# Patient Record
Sex: Female | Born: 1940 | Race: White | Hispanic: No | Smoking: Former smoker
Health system: Southern US, Community
[De-identification: ages and names within clinical notes are randomized; demographics above are authoritative.]

## PROBLEM LIST (undated history)

## (undated) DIAGNOSIS — I10 Essential (primary) hypertension: Secondary | ICD-10-CM

## (undated) DIAGNOSIS — I219 Acute myocardial infarction, unspecified: Secondary | ICD-10-CM

## (undated) DIAGNOSIS — I2510 Atherosclerotic heart disease of native coronary artery without angina pectoris: Secondary | ICD-10-CM

## (undated) DIAGNOSIS — E785 Hyperlipidemia, unspecified: Secondary | ICD-10-CM

## (undated) HISTORY — DX: Atherosclerotic heart disease of native coronary artery without angina pectoris: I25.10

## (undated) HISTORY — PX: ABDOMINAL HYSTERECTOMY: SHX81

## (undated) HISTORY — DX: Hyperlipidemia, unspecified: E78.5

## (undated) HISTORY — DX: Essential (primary) hypertension: I10

## (undated) HISTORY — PX: TONSILLECTOMY: SUR1361

## (undated) HISTORY — PX: APPENDECTOMY: SHX54

## (undated) HISTORY — PX: OTHER SURGICAL HISTORY: SHX169

---

## 1969-03-11 HISTORY — PX: TUBAL LIGATION: SHX77

## 2000-03-11 DIAGNOSIS — I219 Acute myocardial infarction, unspecified: Secondary | ICD-10-CM

## 2000-03-11 HISTORY — DX: Acute myocardial infarction, unspecified: I21.9

## 2005-03-11 HISTORY — PX: OTHER SURGICAL HISTORY: SHX169

## 2011-06-10 HISTORY — PX: CHOLECYSTECTOMY: SHX55

## 2013-05-25 HISTORY — PX: EYE SURGERY: SHX253

## 2014-12-09 HISTORY — PX: OTHER SURGICAL HISTORY: SHX169

## 2015-03-30 ENCOUNTER — Other Ambulatory Visit: Payer: Self-pay | Admitting: Surgical

## 2015-04-04 NOTE — Patient Instructions (Signed)
Rebekah Skinner  04/04/2015   Your procedure is scheduled on: 04-11-15  Report to [facility] Main  Entrance take [facility]  elevators to 3rd floor to  Short Stay Center at 1030  AM.  Call this number if you have problems the morning of surgery [phone]   Remember: ONLY 1 PERSON MAY GO WITH YOU TO SHORT STAY TO GET  READY MORNING OF YOUR SURGERY.  Do not eat food or drink liquids :After Midnight.     Take these medicines the morning of surgery with A SIP OF WATER: NONE              You may not have any metal on your body including hair pins and              piercings  Do not wear jewelry, make-up, lotions, powders or perfumes, deodorant             Do not wear nail polish.  Do not shave  48 hours prior to surgery.              Men may shave face and neck.   Do not bring valuables to the hospital.  IS NOT             RESPONSIBLE   FOR VALUABLES.  Contacts, dentures or bridgework may not be worn into surgery.  Leave suitcase in the car. After surgery it may be brought to your room.                  Please read over the following fact sheets you were given: _____________________________________________________________________             [facility] - Preparing for Surgery Before surgery, you can play an important role.  Because skin is not sterile, your skin needs to be as free of germs as possible.  You can reduce the number of germs on your skin by washing with CHG (chlorahexidine gluconate) soap before surgery.  CHG is an antiseptic cleaner which kills germs and bonds with the skin to continue killing germs even after washing. Please DO NOT use if you have an allergy to CHG or antibacterial soaps.  If your skin becomes reddened/irritated stop using the CHG and inform your nurse when you arrive at Short Stay. Do not shave (including legs and underarms) for at least 48 hours prior to the first CHG shower.  You may shave your face/neck. Please follow  these instructions carefully:  1.  Shower with CHG Soap the night before surgery and the  morning of Surgery.  2.  If you choose to wash your hair, wash your hair first as usual with your  normal  shampoo.  3.  After you shampoo, rinse your hair and body thoroughly to remove the  shampoo.                           4.  Use CHG as you would any other liquid soap.  You can apply chg directly  to the skin and wash                       Gently with a scrungie or clean washcloth.  5.  Apply the CHG Soap to your body ONLY FROM THE NECK DOWN.   Do not use on face/ open  Wound or open sores. Avoid contact with eyes, ears mouth and genitals (private parts).                       Wash face,  Genitals (private parts) with your normal soap.             6.  Wash thoroughly, paying special attention to the area where your surgery  will be performed.  7.  Thoroughly rinse your body with warm water from the neck down.  8.  DO NOT shower/wash with your normal soap after using and rinsing off  the CHG Soap.                9.  Pat yourself dry with a clean towel.            10.  Wear clean pajamas.            11.  Place clean sheets on your bed the night of your first shower and do not  sleep with pets. Day of Surgery : Do not apply any lotions/deodorants the morning of surgery.  Please wear clean clothes to the hospital/surgery center.  FAILURE TO FOLLOW THESE INSTRUCTIONS MAY RESULT IN THE CANCELLATION OF YOUR SURGERY PATIENT SIGNATURE_________________________________  NURSE SIGNATURE__________________________________  ________________________________________________________________________   Adam Phenix  An incentive spirometer is a tool that can help keep your lungs clear and active. This tool measures how well you are filling your lungs with each breath. Taking long deep breaths may help reverse or decrease the chance of developing breathing (pulmonary) problems  (especially infection) following:  A long period of time when you are unable to move or be active. BEFORE THE PROCEDURE   If the spirometer includes an indicator to show your best effort, your nurse or respiratory therapist will set it to a desired goal.  If possible, sit up straight or lean slightly forward. Try not to slouch.  Hold the incentive spirometer in an upright position. INSTRUCTIONS FOR USE   Sit on the edge of your bed if possible, or sit up as far as you can in bed or on a chair.  Hold the incentive spirometer in an upright position.  Breathe out normally.  Place the mouthpiece in your mouth and seal your lips tightly around it.  Breathe in slowly and as deeply as possible, raising the piston or the ball toward the top of the column.  Hold your breath for 3-5 seconds or for as long as possible. Allow the piston or ball to fall to the bottom of the column.  Remove the mouthpiece from your mouth and breathe out normally.  Rest for a few seconds and repeat Steps 1 through 7 at least 10 times every 1-2 hours when you are awake. Take your time and take a few normal breaths between deep breaths.  The spirometer may include an indicator to show your best effort. Use the indicator as a goal to work toward during each repetition.  After each set of 10 deep breaths, practice coughing to be sure your lungs are clear. If you have an incision (the cut made at the time of surgery), support your incision when coughing by placing a pillow or rolled up towels firmly against it. Once you are able to get out of bed, walk around indoors and cough well. You may stop using the incentive spirometer when instructed by your caregiver.  RISKS AND COMPLICATIONS  Take your time so you do not get  dizzy or light-headed.  If you are in pain, you may need to take or ask for pain medication before doing incentive spirometry. It is harder to take a deep breath if you are having pain. AFTER  USE  Rest and breathe slowly and easily.  It can be helpful to keep track of a log of your progress. Your caregiver can provide you with a simple table to help with this. If you are using the spirometer at home, follow these instructions: [place] IF:   You are having difficultly using the spirometer.  You have trouble using the spirometer as often as instructed.  Your pain medication is not giving enough relief while using the spirometer.  You develop fever of 100.5 F (38.1 C) or higher. SEEK IMMEDIATE MEDICAL CARE IF:   You cough up bloody sputum that had not been present before.  You develop fever of 102 F (38.9 C) or greater.  You develop worsening pain at or near the incision site. MAKE SURE YOU:   Understand these instructions.  Will watch your condition.  Will get help right away if you are not doing well or get worse. Document Released: 07/08/2006 Document Revised: 05/20/2011 Document Reviewed: 09/08/2006 Exit[employer] Patient Information 2014 Exit[employer], [place].   ________________________________________________________________________  WHAT IS A BLOOD TRANSFUSION? Blood Transfusion Information  A transfusion is the replacement of blood or some of its parts. Blood is made up of multiple cells which provide different functions.  Red blood cells carry oxygen and are used for blood loss replacement.  White blood cells fight against infection.  Platelets control bleeding.  Plasma helps clot blood.  Other blood products are available for specialized needs, such as hemophilia or other clotting disorders. BEFORE THE TRANSFUSION  Who gives blood for transfusions?   Healthy volunteers who are fully evaluated to make sure their blood is safe. This is blood bank blood. Transfusion therapy is the safest it has ever been in the practice of medicine. Before blood is taken from a donor, a complete history is taken to make sure that person has no history of diseases  nor engages in risky social behavior (examples are intravenous drug use or sexual activity with multiple partners). The donor's travel history is screened to minimize risk of transmitting infections, such as malaria. The donated blood is tested for signs of infectious diseases, such as HIV and hepatitis. The blood is then tested to be sure it is compatible with you in order to minimize the chance of a transfusion reaction. If you or a relative donates blood, this is often done in anticipation of surgery and is not appropriate for emergency situations. It takes many days to process the donated blood. RISKS AND COMPLICATIONS Although transfusion therapy is very safe and saves many lives, the main dangers of transfusion include:   Getting an infectious disease.  Developing a transfusion reaction. This is an allergic reaction to something in the blood you were given. Every precaution is taken to prevent this. The decision to have a blood transfusion has been considered carefully by your caregiver before blood is given. Blood is not given unless the benefits outweigh the risks. AFTER THE TRANSFUSION  Right after receiving a blood transfusion, you will usually feel much better and more energetic. This is especially true if your red blood cells have gotten low (anemic). The transfusion raises the level of the red blood cells which carry oxygen, and this usually causes an energy increase.  The nurse administering the transfusion will  monitor you carefully for complications. HOME CARE INSTRUCTIONS  No special instructions are needed after a transfusion. You may find your energy is better. Speak with your caregiver about any limitations on activity for underlying diseases you may have. SEEK MEDICAL CARE IF:   Your condition is not improving after your transfusion.  You develop redness or irritation at the intravenous (IV) site. SEEK IMMEDIATE MEDICAL CARE IF:  Any of the following symptoms occur over the  next 12 hours:  Shaking chills.  You have a temperature by mouth above 102 F (38.9 C), not controlled by medicine.  Chest, back, or muscle pain.  People around you feel you are not acting correctly or are confused.  Shortness of breath or difficulty breathing.  Dizziness and fainting.  You get a rash or develop hives.  You have a decrease in urine output.  Your urine turns a dark color or changes to pink, red, or brown. Any of the following symptoms occur over the next 10 days:  You have a temperature by mouth above 102 F (38.9 C), not controlled by medicine.  Shortness of breath.  Weakness after normal activity.  The white part of the eye turns yellow (jaundice).  You have a decrease in the amount of urine or are urinating less often.  Your urine turns a dark color or changes to pink, red, or brown. Document Released: 02/23/2000 Document Revised: 05/20/2011 Document Reviewed: 10/12/2007 [facility] Patient Information 2014 [place], [place].  _______________________________________________________________________

## 2015-04-04 NOTE — Progress Notes (Signed)
EKG 11-21-14, ECHO 11-21-14 LEXISCAN 11-28-14 AND LOV DR Jillyn Hidden [facility] ON CHART. REQUESTED ANY CARDIOLOGY CLEARANCE NOTE DR VELVET AT DR GIOFFRE;S OFFICE TO BE FAXED.

## 2015-04-05 NOTE — Progress Notes (Signed)
Cardiac clearance note dr Hyacinth Meeker on chart for 04-11-15 surgery

## 2015-04-07 ENCOUNTER — Encounter (HOSPITAL_COMMUNITY): Payer: Self-pay

## 2015-04-07 ENCOUNTER — Encounter (HOSPITAL_COMMUNITY)
Admission: RE | Admit: 2015-04-07 | Discharge: 2015-04-07 | Disposition: A | Discharge status: Home / Self Care | Payer: Medicare Other | Source: Ambulatory Visit | Attending: Orthopedic Surgery | Admitting: Orthopedic Surgery

## 2015-04-07 ENCOUNTER — Ambulatory Visit (HOSPITAL_COMMUNITY)
Admission: RE | Admit: 2015-04-07 | Discharge: 2015-04-07 | Disposition: A | Discharge status: Home / Self Care | Payer: Medicare Other | Source: Ambulatory Visit | Attending: Surgical | Admitting: Surgical

## 2015-04-07 DIAGNOSIS — Z01818 Encounter for other preprocedural examination: Secondary | ICD-10-CM | POA: Insufficient documentation

## 2015-04-07 HISTORY — DX: Acute myocardial infarction, unspecified: I21.9

## 2015-04-07 LAB — URINALYSIS, ROUTINE W REFLEX MICROSCOPIC
Bilirubin Urine: NEGATIVE
Glucose, UA: NEGATIVE mg/dL
Hgb urine dipstick: NEGATIVE
Ketones, ur: NEGATIVE mg/dL
Nitrite: NEGATIVE
Protein, ur: NEGATIVE mg/dL
Specific Gravity, Urine: 1.027 (ref 1.005–1.030)
pH: 5.5 (ref 5.0–8.0)

## 2015-04-07 LAB — CBC WITH DIFFERENTIAL/PLATELET
Basophils Absolute: 0 10*3/uL (ref 0.0–0.1)
Basophils Relative: 0 %
Eosinophils Absolute: 0.1 10*3/uL (ref 0.0–0.7)
Eosinophils Relative: 1 %
HCT: 37.1 % (ref 36.0–46.0)
Hemoglobin: 11.9 g/dL — ABNORMAL LOW (ref 12.0–15.0)
Lymphocytes Relative: 26 %
Lymphs Abs: 3 10*3/uL (ref 0.7–4.0)
MCH: 31.1 pg (ref 26.0–34.0)
MCHC: 32.1 g/dL (ref 30.0–36.0)
MCV: 96.9 fL (ref 78.0–100.0)
Monocytes Absolute: 0.8 10*3/uL (ref 0.1–1.0)
Monocytes Relative: 7 %
Neutro Abs: 7.7 10*3/uL (ref 1.7–7.7)
Neutrophils Relative %: 66 %
Platelets: 244 10*3/uL (ref 150–400)
RBC: 3.83 MIL/uL — ABNORMAL LOW (ref 3.87–5.11)
RDW: 14.1 % (ref 11.5–15.5)
WBC: 11.6 10*3/uL — ABNORMAL HIGH (ref 4.0–10.5)

## 2015-04-07 LAB — COMPREHENSIVE METABOLIC PANEL
ALT: 26 U/L (ref 14–54)
AST: 30 U/L (ref 15–41)
Albumin: 3.8 g/dL (ref 3.5–5.0)
Alkaline Phosphatase: 77 U/L (ref 38–126)
Anion gap: 11 (ref 5–15)
BUN: 29 mg/dL — ABNORMAL HIGH (ref 6–20)
CO2: 22 mmol/L (ref 22–32)
Calcium: 9 mg/dL (ref 8.9–10.3)
Chloride: 104 mmol/L (ref 101–111)
Creatinine, Ser: 1.25 mg/dL — ABNORMAL HIGH (ref 0.44–1.00)
GFR calc Af Amer: 48 mL/min — ABNORMAL LOW (ref >60)
GFR calc non Af Amer: 42 mL/min — ABNORMAL LOW (ref >60)
Glucose, Bld: 88 mg/dL (ref 65–99)
Potassium: 5.1 mmol/L (ref 3.5–5.1)
Sodium: 137 mmol/L (ref 135–145)
Total Bilirubin: 0.4 mg/dL (ref 0.3–1.2)
Total Protein: 7.3 g/dL (ref 6.5–8.1)

## 2015-04-07 LAB — URINE MICROSCOPIC-ADD ON

## 2015-04-07 LAB — PROTIME-INR
INR: 1.11 (ref 0.00–1.49)
Prothrombin Time: 14 seconds (ref 11.6–15.2)

## 2015-04-07 LAB — SURGICAL PCR SCREEN
MRSA, PCR: NEGATIVE
Staphylococcus aureus: NEGATIVE

## 2015-04-07 LAB — ABO/RH: ABO/RH(D): O POS

## 2015-04-07 LAB — APTT: aPTT: 22 seconds — ABNORMAL LOW (ref 24–37)

## 2015-04-07 NOTE — Progress Notes (Signed)
Micro, ua results faxed by epic to dr Darrelyn Hillock

## 2015-04-10 NOTE — H&P (Signed)
TOTAL KNEE ADMISSION H&P  Patient is being admitted for right total knee arthroplasty.  Subjective:  Chief Complaint:right knee pain.  HPI: Rebekah Skinner, 75 y.o. female, has a history of pain and functional disability in the right knee due to arthritis and has failed non-surgical conservative treatments for greater than 12 weeks to includeNSAID's and/or analgesics, corticosteriod injections, flexibility and strengthening excercises and activity modification.  Onset of symptoms was gradual, starting 5 years ago with gradually worsening course since that time. The patient noted prior procedures on the knee to include  arthroscopy and menisectomy on the right knee(s).  Patient currently rates pain in the right knee(s) at 7 out of 10 with activity. Patient has night pain, worsening of pain with activity and weight bearing, pain that interferes with activities of daily living, pain with passive range of motion, crepitus and joint swelling.  Patient has evidence of periarticular osteophytes and joint space narrowing by imaging studies. There is no active infection.   Past Medical History  Diagnosis Date  . Myocardial infarction [facility]) 2002    Past Surgical History  Procedure Laterality Date  . Tonsillectomy  7th grade  . Appendectomy  8th grade  . Tubal ligation  1971  . Abdominal hysterectomy  1 ovary removed  . Surgery for ruptured abdominal aortic aneurysm  2007  . Cholecystectomy  april 2013  . Other surgery  2007    12 other surgeries due to ruptued aneurysm  . Right knee meniscus repair  sept 30, 2016  . Eye surgery Left 05-25-2013    cataract lens replacement  . 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  aspirin EC 81 MG tablet, Take 81 mg by mouth at bedtime., Disp: , Rfl:  .  cholecalciferol (VITAMIN D) 1000 units tablet, Take 1,000 Units by mouth daily., Disp: , Rfl:  .  losartan (COZAAR) 100 MG tablet, Take 100 mg by mouth at bedtime.,  Disp: , Rfl:  .  meloxicam (MOBIC) 15 MG tablet, Take 15 mg by mouth daily., Disp: , Rfl:  .  vitamin B-12 (CYANOCOBALAMIN) 1000 MCG tablet, Take 1,000 mcg by mouth daily., Disp: , Rfl:   Allergies  Allergen Reactions  . Bactrim Ds [Sulfamethoxazole-Trimethoprim] Nausea And Vomiting  . Darvon [Propoxyphene] Nausea And Vomiting  . Docusate Nausea And Vomiting  . Other     Darvocet=nausea vomiting  . Zyvox [Linezolid] Nausea And Vomiting    Social History  Substance Use Topics  . Smoking status: Former Smoker    Types: Cigarettes  . Smokeless tobacco: Never Used     Comment: social smoker quit years ago  . Alcohol Use: No    Review of Systems  Constitutional: Negative.   HENT: Negative.   Eyes: Negative.   Respiratory: Positive for shortness of breath. Negative for cough, hemoptysis, sputum production and wheezing.        SOB with exertion  Cardiovascular: Negative.   Gastrointestinal: Positive for diarrhea. Negative for heartburn, nausea, vomiting, abdominal pain, constipation, blood in stool and melena.  Genitourinary: Positive for frequency. Negative for dysuria, urgency, hematuria and flank pain.  Musculoskeletal: Positive for myalgias and joint pain. Negative for back pain, falls and neck pain.       Right knee pain  Skin: Negative.   Neurological: Negative.   Endo/Heme/Allergies: Negative.   Psychiatric/Behavioral: Negative.     Objective:  Physical Exam  Constitutional: She is oriented to person,  place, and time. She appears well-developed. No distress.  Overweight  HENT:  Head: Normocephalic and atraumatic.  Right Ear: External ear normal.  Left Ear: External ear normal.  Nose: Nose normal.  Mouth/Throat: Oropharynx is clear and moist.  Eyes: Conjunctivae and EOM are normal.  Neck: Normal range of motion. Neck supple.  Cardiovascular: Normal rate, regular rhythm, normal heart sounds and intact distal pulses.   No murmur heard. Respiratory: Effort normal and  breath sounds normal. No respiratory distress. She has no wheezes.  GI: Soft. She exhibits no distension. There is no tenderness.  Musculoskeletal:       Right hip: Normal.       Left hip: Normal.       Right knee: She exhibits decreased range of motion and swelling. She exhibits no effusion and no erythema. Tenderness found. Medial joint line and lateral joint line tenderness noted.       Left knee: Normal.  Neurological: She is alert and oriented to person, place, and time. She has normal strength and normal reflexes. No sensory deficit.  Skin: No rash noted. She is not diaphoretic. No erythema.  Psychiatric: She has a normal mood and affect. Her behavior is normal.   Vitals  Weight: 216 lb Height: 65in Body Surface Area: 2.04 m Body Mass Index: 35.94 kg/m  Pulse: 80 (Regular)  BP: 135/76 (Sitting, Left Arm, Standard)   Imaging Review Plain radiographs demonstrate severe degenerative joint disease of the right knee(s). The overall alignment ismild valgus. The bone quality appears to be good for age and reported activity level.  Assessment/Plan:  End stage primary osteoarthritis, right knee   The patient history, physical examination, clinical judgment of the provider and imaging studies are consistent with end stage degenerative joint disease of the right knee(s) and total knee arthroplasty is deemed medically necessary. The treatment options including medical management, injection therapy arthroscopy and arthroplasty were discussed at length. The risks and benefits of total knee arthroplasty were presented and reviewed. The risks due to aseptic loosening, infection, stiffness, patella tracking problems, thromboembolic complications and other imponderables were discussed. The patient acknowledged the explanation, agreed to proceed with the plan and consent was signed. Patient is being admitted for inpatient treatment for surgery, pain control, PT, OT, prophylactic antibiotics,  VTE prophylaxis, progressive ambulation and ADL's and discharge planning. The patient is planning to be discharged home with home health services     PCP: Dr. Lynnell Chad, PA-C

## 2015-04-11 ENCOUNTER — Encounter (HOSPITAL_COMMUNITY): Payer: Self-pay | Admitting: *Deleted

## 2015-04-11 ENCOUNTER — Inpatient Hospital Stay (HOSPITAL_COMMUNITY): Payer: Medicare Other | Admitting: Registered Nurse

## 2015-04-11 ENCOUNTER — Inpatient Hospital Stay (HOSPITAL_COMMUNITY)
Admission: RE | Admit: 2015-04-11 | Discharge: 2015-04-13 | DRG: 470 | Disposition: A | Discharge status: Home Health | Payer: Medicare Other | Source: Ambulatory Visit | Attending: Orthopedic Surgery | Admitting: Orthopedic Surgery

## 2015-04-11 ENCOUNTER — Encounter (HOSPITAL_COMMUNITY)
Admission: RE | Disposition: A | Discharge status: Home Health | Payer: Self-pay | Source: Ambulatory Visit | Attending: Orthopedic Surgery

## 2015-04-11 DIAGNOSIS — Z7982 Long term (current) use of aspirin: Secondary | ICD-10-CM

## 2015-04-11 DIAGNOSIS — M1711 Unilateral primary osteoarthritis, right knee: Secondary | ICD-10-CM | POA: Diagnosis present

## 2015-04-11 DIAGNOSIS — M24561 Contracture, right knee: Secondary | ICD-10-CM | POA: Diagnosis present

## 2015-04-11 DIAGNOSIS — Z79899 Other long term (current) drug therapy: Secondary | ICD-10-CM | POA: Diagnosis not present

## 2015-04-11 DIAGNOSIS — Z96659 Presence of unspecified artificial knee joint: Secondary | ICD-10-CM

## 2015-04-11 DIAGNOSIS — Z6835 Body mass index (BMI) 35.0-35.9, adult: Secondary | ICD-10-CM

## 2015-04-11 DIAGNOSIS — Z87891 Personal history of nicotine dependence: Secondary | ICD-10-CM

## 2015-04-11 DIAGNOSIS — M25561 Pain in right knee: Secondary | ICD-10-CM | POA: Diagnosis present

## 2015-04-11 DIAGNOSIS — I252 Old myocardial infarction: Secondary | ICD-10-CM | POA: Diagnosis not present

## 2015-04-11 HISTORY — PX: TOTAL KNEE ARTHROPLASTY: SHX125

## 2015-04-11 LAB — TYPE AND SCREEN
ABO/RH(D): O POS
Antibody Screen: NEGATIVE

## 2015-04-11 SURGERY — ARTHROPLASTY, KNEE, TOTAL
Anesthesia: General | Site: Knee | Laterality: Right

## 2015-04-11 MED ORDER — FLEET ENEMA 7-19 GM/118ML RE ENEM: 1.0000 | ENEMA | Freq: Once | RECTAL | Status: DC | PRN

## 2015-04-11 MED ORDER — CEFAZOLIN SODIUM 1-5 GM-% IV SOLN
1.0000 g | Freq: Four times a day (QID) | INTRAVENOUS | Status: AC
  Administered 2015-04-11 (×2): 1 g via INTRAVENOUS
  Filled 2015-04-11 (×2): qty 50

## 2015-04-11 MED ORDER — LACTATED RINGERS IV SOLN
INTRAVENOUS | Status: DC
  Administered 2015-04-11 – 2015-04-12 (×3): via INTRAVENOUS

## 2015-04-11 MED ORDER — BUPIVACAINE-EPINEPHRINE (PF) 0.5% -1:200000 IJ SOLN
INTRAMUSCULAR | Status: AC
  Filled 2015-04-11: qty 30

## 2015-04-11 MED ORDER — POLYETHYLENE GLYCOL 3350 17 G PO PACK: 17.0000 g | PACK | Freq: Every day | ORAL | Status: DC | PRN

## 2015-04-11 MED ORDER — LIDOCAINE HCL (CARDIAC) 20 MG/ML IV SOLN
INTRAVENOUS | Status: DC | PRN
  Administered 2015-04-11: 80 mg via INTRAVENOUS

## 2015-04-11 MED ORDER — FERROUS SULFATE 325 (65 FE) MG PO TABS
325.0000 mg | ORAL_TABLET | Freq: Three times a day (TID) | ORAL | Status: DC
  Administered 2015-04-11 – 2015-04-13 (×5): 325 mg via ORAL
  Filled 2015-04-11 (×8): qty 1

## 2015-04-11 MED ORDER — SUGAMMADEX SODIUM 200 MG/2ML IV SOLN
INTRAVENOUS | Status: DC | PRN
  Administered 2015-04-11: 200 mg via INTRAVENOUS

## 2015-04-11 MED ORDER — SUGAMMADEX SODIUM 200 MG/2ML IV SOLN
INTRAVENOUS | Status: AC
  Filled 2015-04-11: qty 2

## 2015-04-11 MED ORDER — SODIUM CHLORIDE 0.9 % IJ SOLN
INTRAMUSCULAR | Status: DC | PRN
  Administered 2015-04-11: 20 mL

## 2015-04-11 MED ORDER — ONDANSETRON HCL 4 MG/2ML IJ SOLN
INTRAMUSCULAR | Status: DC | PRN
  Administered 2015-04-11: 4 mg via INTRAVENOUS

## 2015-04-11 MED ORDER — FENTANYL CITRATE (PF) 100 MCG/2ML IJ SOLN
INTRAMUSCULAR | Status: AC
  Filled 2015-04-11: qty 2

## 2015-04-11 MED ORDER — DEXAMETHASONE SODIUM PHOSPHATE 10 MG/ML IJ SOLN
INTRAMUSCULAR | Status: AC
  Filled 2015-04-11: qty 1

## 2015-04-11 MED ORDER — LOSARTAN POTASSIUM 50 MG PO TABS
100.0000 mg | ORAL_TABLET | Freq: Every day | ORAL | Status: DC
  Administered 2015-04-11 – 2015-04-12 (×2): 100 mg via ORAL
  Filled 2015-04-11 (×3): qty 2

## 2015-04-11 MED ORDER — BUPIVACAINE-EPINEPHRINE (PF) 0.5% -1:200000 IJ SOLN
INTRAMUSCULAR | Status: DC | PRN
  Administered 2015-04-11: 25 mL

## 2015-04-11 MED ORDER — HYDROCODONE-ACETAMINOPHEN 5-325 MG PO TABS
1.0000 | ORAL_TABLET | ORAL | Status: DC | PRN
  Administered 2015-04-12 (×4): 2 via ORAL
  Filled 2015-04-11 (×5): qty 2

## 2015-04-11 MED ORDER — BUPIVACAINE HCL (PF) 0.25 % IJ SOLN
INTRAMUSCULAR | Status: DC | PRN
  Administered 2015-04-11: 20 mL

## 2015-04-11 MED ORDER — LIDOCAINE HCL (CARDIAC) 20 MG/ML IV SOLN
INTRAVENOUS | Status: AC
  Filled 2015-04-11: qty 5

## 2015-04-11 MED ORDER — HYDROMORPHONE HCL 1 MG/ML IJ SOLN
INTRAMUSCULAR | Status: DC | PRN
  Administered 2015-04-11 (×2): 0.5 mg via INTRAVENOUS

## 2015-04-11 MED ORDER — SODIUM CHLORIDE 0.9 % IJ SOLN
INTRAMUSCULAR | Status: AC
  Filled 2015-04-11: qty 20

## 2015-04-11 MED ORDER — ONDANSETRON HCL 4 MG/2ML IJ SOLN
4.0000 mg | Freq: Four times a day (QID) | INTRAMUSCULAR | Status: DC | PRN
  Administered 2015-04-11: 4 mg via INTRAVENOUS
  Filled 2015-04-11: qty 2

## 2015-04-11 MED ORDER — THROMBIN 5000 UNITS EX SOLR
OROMUCOSAL | Status: DC | PRN
  Administered 2015-04-11: 14:00:00 via TOPICAL

## 2015-04-11 MED ORDER — FENTANYL CITRATE (PF) 100 MCG/2ML IJ SOLN
INTRAMUSCULAR | Status: DC | PRN
  Administered 2015-04-11 (×5): 50 ug via INTRAVENOUS

## 2015-04-11 MED ORDER — BUPIVACAINE LIPOSOME 1.3 % IJ SUSP
20.0000 mL | Freq: Once | INTRAMUSCULAR | Status: AC
  Administered 2015-04-11: 20 mL
  Filled 2015-04-11: qty 20

## 2015-04-11 MED ORDER — ONDANSETRON HCL 4 MG PO TABS: 4.0000 mg | ORAL_TABLET | Freq: Four times a day (QID) | ORAL | Status: DC | PRN

## 2015-04-11 MED ORDER — RIVAROXABAN 10 MG PO TABS
10.0000 mg | ORAL_TABLET | Freq: Every day | ORAL | Status: DC
  Administered 2015-04-12 – 2015-04-13 (×2): 10 mg via ORAL
  Filled 2015-04-11 (×3): qty 1

## 2015-04-11 MED ORDER — SUCCINYLCHOLINE CHLORIDE 20 MG/ML IJ SOLN
INTRAMUSCULAR | Status: DC | PRN
  Administered 2015-04-11: 100 mg via INTRAVENOUS

## 2015-04-11 MED ORDER — BISACODYL 5 MG PO TBEC: 5.0000 mg | DELAYED_RELEASE_TABLET | Freq: Every day | ORAL | Status: DC | PRN

## 2015-04-11 MED ORDER — MIDAZOLAM HCL 5 MG/5ML IJ SOLN
INTRAMUSCULAR | Status: DC | PRN
  Administered 2015-04-11 (×2): 1 mg via INTRAVENOUS

## 2015-04-11 MED ORDER — PHENOL 1.4 % MT LIQD
1.0000 | OROMUCOSAL | Status: DC | PRN
  Filled 2015-04-11: qty 177

## 2015-04-11 MED ORDER — PROPOFOL 10 MG/ML IV BOLUS
INTRAVENOUS | Status: AC
  Filled 2015-04-11: qty 20

## 2015-04-11 MED ORDER — PROPOFOL 10 MG/ML IV BOLUS
INTRAVENOUS | Status: DC | PRN
  Administered 2015-04-11: 160 mg via INTRAVENOUS

## 2015-04-11 MED ORDER — METHOCARBAMOL 500 MG PO TABS
500.0000 mg | ORAL_TABLET | Freq: Four times a day (QID) | ORAL | Status: DC | PRN
  Administered 2015-04-12: 500 mg via ORAL
  Filled 2015-04-11: qty 1

## 2015-04-11 MED ORDER — FENTANYL CITRATE (PF) 100 MCG/2ML IJ SOLN
25.0000 ug | INTRAMUSCULAR | Status: DC | PRN
  Administered 2015-04-11 (×3): 50 ug via INTRAVENOUS

## 2015-04-11 MED ORDER — VITAMIN B-12 1000 MCG PO TABS
1000.0000 ug | ORAL_TABLET | Freq: Every day | ORAL | Status: DC
  Administered 2015-04-11 – 2015-04-13 (×3): 1000 ug via ORAL
  Filled 2015-04-11 (×3): qty 1

## 2015-04-11 MED ORDER — FENTANYL CITRATE (PF) 250 MCG/5ML IJ SOLN
INTRAMUSCULAR | Status: AC
  Filled 2015-04-11: qty 5

## 2015-04-11 MED ORDER — SODIUM CHLORIDE 0.9 % IR SOLN
Status: DC | PRN
  Administered 2015-04-11: 500 mL

## 2015-04-11 MED ORDER — ACETAMINOPHEN 650 MG RE SUPP: 650.0000 mg | Freq: Four times a day (QID) | RECTAL | Status: DC | PRN

## 2015-04-11 MED ORDER — THROMBIN 5000 UNITS EX SOLR
CUTANEOUS | Status: AC
  Filled 2015-04-11: qty 5000

## 2015-04-11 MED ORDER — LACTATED RINGERS IV SOLN
INTRAVENOUS | Status: DC
  Administered 2015-04-11: 12:00:00 via INTRAVENOUS

## 2015-04-11 MED ORDER — MENTHOL 3 MG MT LOZG
1.0000 | LOZENGE | OROMUCOSAL | Status: DC | PRN
  Filled 2015-04-11: qty 9

## 2015-04-11 MED ORDER — ACETAMINOPHEN 325 MG PO TABS: 650.0000 mg | ORAL_TABLET | Freq: Four times a day (QID) | ORAL | Status: DC | PRN

## 2015-04-11 MED ORDER — OXYCODONE-ACETAMINOPHEN 5-325 MG PO TABS
2.0000 | ORAL_TABLET | ORAL | Status: DC | PRN
  Administered 2015-04-11 – 2015-04-13 (×4): 2 via ORAL
  Filled 2015-04-11 (×4): qty 2

## 2015-04-11 MED ORDER — BUPIVACAINE HCL (PF) 0.25 % IJ SOLN
INTRAMUSCULAR | Status: AC
  Filled 2015-04-11: qty 30

## 2015-04-11 MED ORDER — HYDROMORPHONE HCL 2 MG/ML IJ SOLN
INTRAMUSCULAR | Status: AC
  Filled 2015-04-11: qty 1

## 2015-04-11 MED ORDER — HYDROMORPHONE HCL 1 MG/ML IJ SOLN: 1.0000 mg | INTRAMUSCULAR | Status: DC | PRN

## 2015-04-11 MED ORDER — TRANEXAMIC ACID 1000 MG/10ML IV SOLN
2000.0000 mg | Freq: Once | INTRAVENOUS | Status: DC
  Filled 2015-04-11: qty 20

## 2015-04-11 MED ORDER — TRANEXAMIC ACID 1000 MG/10ML IV SOLN
2000.0000 mg | INTRAVENOUS | Status: DC | PRN
  Administered 2015-04-11: 2000 mg via TOPICAL

## 2015-04-11 MED ORDER — CELECOXIB 200 MG PO CAPS
200.0000 mg | ORAL_CAPSULE | Freq: Two times a day (BID) | ORAL | Status: DC
  Administered 2015-04-11 – 2015-04-13 (×4): 200 mg via ORAL
  Filled 2015-04-11 (×5): qty 1

## 2015-04-11 MED ORDER — METHOCARBAMOL 1000 MG/10ML IJ SOLN
500.0000 mg | Freq: Four times a day (QID) | INTRAVENOUS | Status: DC | PRN
  Administered 2015-04-11: 500 mg via INTRAVENOUS
  Filled 2015-04-11 (×2): qty 5

## 2015-04-11 MED ORDER — ALUM & MAG HYDROXIDE-SIMETH 200-200-20 MG/5ML PO SUSP: 30.0000 mL | ORAL | Status: DC | PRN

## 2015-04-11 MED ORDER — SODIUM CHLORIDE 0.9 % IR SOLN
Status: AC
  Filled 2015-04-11: qty 1

## 2015-04-11 MED ORDER — CEFAZOLIN SODIUM-DEXTROSE 2-3 GM-% IV SOLR
2.0000 g | INTRAVENOUS | Status: AC
  Administered 2015-04-11: 2 g via INTRAVENOUS

## 2015-04-11 MED ORDER — ROCURONIUM BROMIDE 100 MG/10ML IV SOLN
INTRAVENOUS | Status: DC | PRN
  Administered 2015-04-11: 30 mg via INTRAVENOUS

## 2015-04-11 MED ORDER — CEFAZOLIN SODIUM-DEXTROSE 2-3 GM-% IV SOLR
INTRAVENOUS | Status: AC
  Filled 2015-04-11: qty 50

## 2015-04-11 MED ORDER — DEXAMETHASONE SODIUM PHOSPHATE 10 MG/ML IJ SOLN
INTRAMUSCULAR | Status: DC | PRN
  Administered 2015-04-11: 10 mg via INTRAVENOUS

## 2015-04-11 MED ORDER — MIDAZOLAM HCL 2 MG/2ML IJ SOLN
INTRAMUSCULAR | Status: AC
  Filled 2015-04-11: qty 2

## 2015-04-11 MED ORDER — ONDANSETRON HCL 4 MG/2ML IJ SOLN: 4.0000 mg | Freq: Once | INTRAMUSCULAR | Status: DC | PRN

## 2015-04-11 SURGICAL SUPPLY — 63 items
BAG DECANTER FOR FLEXI CONT (MISCELLANEOUS) IMPLANT
BAG ZIPLOCK 12X15 (MISCELLANEOUS) IMPLANT
BANDAGE ACE 4X5 VEL STRL LF (GAUZE/BANDAGES/DRESSINGS) ×3 IMPLANT
BANDAGE ACE 6X5 VEL STRL LF (GAUZE/BANDAGES/DRESSINGS) ×3 IMPLANT
BLADE SAG 18X100X1.27 (BLADE) ×3 IMPLANT
BLADE SAW SGTL 11.0X1.19X90.0M (BLADE) ×3 IMPLANT
BNDG COHESIVE 4X5 TAN NS LF (GAUZE/BANDAGES/DRESSINGS) ×6 IMPLANT
BNDG GAUZE ELAST 4 BULKY (GAUZE/BANDAGES/DRESSINGS) ×6 IMPLANT
BONE CEMENT GENTAMICIN (Cement) ×6 IMPLANT
CAP KNEE TOTAL 3 SIGMA ×3 IMPLANT
CEMENT BONE GENTAMICIN 40 (Cement) ×2 IMPLANT
CLOSURE WOUND 1/2 X4 (GAUZE/BANDAGES/DRESSINGS) ×1
CLOTH BEACON ORANGE TIMEOUT ST (SAFETY) ×3 IMPLANT
CUFF TOURN SGL QUICK 34 (TOURNIQUET CUFF) ×2
CUFF TRNQT CYL 34X4X40X1 (TOURNIQUET CUFF) ×1 IMPLANT
DECANTER SPIKE VIAL GLASS SM (MISCELLANEOUS) ×3 IMPLANT
DRAPE INCISE IOBAN 66X45 STRL (DRAPES) IMPLANT
DRAPE U-SHAPE 47X51 STRL (DRAPES) ×3 IMPLANT
DRSG ADAPTIC 3X8 NADH LF (GAUZE/BANDAGES/DRESSINGS) ×3 IMPLANT
DRSG PAD ABDOMINAL 8X10 ST (GAUZE/BANDAGES/DRESSINGS) ×6 IMPLANT
DURAPREP 26ML APPLICATOR (WOUND CARE) ×3 IMPLANT
ELECT REM PT RETURN 9FT ADLT (ELECTROSURGICAL) ×3
ELECTRODE REM PT RTRN 9FT ADLT (ELECTROSURGICAL) ×1 IMPLANT
EVACUATOR 1/8 PVC DRAIN (DRAIN) ×3 IMPLANT
GAUZE SPONGE 4X4 12PLY STRL (GAUZE/BANDAGES/DRESSINGS) ×3 IMPLANT
GLOVE BIOGEL PI IND STRL 6.5 (GLOVE) ×1 IMPLANT
GLOVE BIOGEL PI IND STRL 8 (GLOVE) ×1 IMPLANT
GLOVE BIOGEL PI INDICATOR 6.5 (GLOVE) ×2
GLOVE BIOGEL PI INDICATOR 8 (GLOVE) ×2
GLOVE ECLIPSE 8.0 STRL XLNG CF (GLOVE) ×6 IMPLANT
GLOVE SURG SS PI 6.5 STRL IVOR (GLOVE) ×3 IMPLANT
GOWN STRL REUS W/TWL LRG LVL3 (GOWN DISPOSABLE) ×3 IMPLANT
GOWN STRL REUS W/TWL XL LVL3 (GOWN DISPOSABLE) ×3 IMPLANT
HANDPIECE INTERPULSE COAX TIP (DISPOSABLE) ×2
IMMOBILIZER KNEE 20 (SOFTGOODS) ×3
IMMOBILIZER KNEE 20 THIGH 36 (SOFTGOODS) ×1 IMPLANT
LIQUID BAND (GAUZE/BANDAGES/DRESSINGS) ×3 IMPLANT
MANIFOLD NEPTUNE II (INSTRUMENTS) ×3 IMPLANT
NEEDLE HYPO 21X1.5 SAFETY (NEEDLE) ×3 IMPLANT
NEEDLE HYPO 22GX1.5 SAFETY (NEEDLE) ×3 IMPLANT
NS IRRIG 1000ML POUR BTL (IV SOLUTION) IMPLANT
PACK TOTAL KNEE CUSTOM (KITS) ×3 IMPLANT
PENCIL SMOKE EVAC W/HOLSTER (ELECTROSURGICAL) ×3 IMPLANT
POSITIONER SURGICAL ARM (MISCELLANEOUS) ×3 IMPLANT
SET HNDPC FAN SPRY TIP SCT (DISPOSABLE) ×1 IMPLANT
SET PAD KNEE POSITIONER (MISCELLANEOUS) ×3 IMPLANT
SPONGE LAP 18X18 X RAY DECT (DISPOSABLE) IMPLANT
SPONGE SURGIFOAM ABS GEL 100 (HEMOSTASIS) ×3 IMPLANT
STRIP CLOSURE SKIN 1/2X4 (GAUZE/BANDAGES/DRESSINGS) ×2 IMPLANT
SUT BONE WAX W31G (SUTURE) ×3 IMPLANT
SUT MNCRL AB 4-0 PS2 18 (SUTURE) ×3 IMPLANT
SUT VIC AB 1 CT1 27 (SUTURE) ×6
SUT VIC AB 1 CT1 27XBRD ANTBC (SUTURE) ×3 IMPLANT
SUT VIC AB 2-0 CT1 27 (SUTURE) ×6
SUT VIC AB 2-0 CT1 TAPERPNT 27 (SUTURE) ×3 IMPLANT
SUT VLOC 180 0 24IN GS25 (SUTURE) ×3 IMPLANT
SYR 20CC LL (SYRINGE) ×6 IMPLANT
TOWER CARTRIDGE SMART MIX (DISPOSABLE) ×3 IMPLANT
TRAY FOLEY W/METER SILVER 14FR (SET/KITS/TRAYS/PACK) ×3 IMPLANT
TRAY FOLEY W/METER SILVER 16FR (SET/KITS/TRAYS/PACK) ×3 IMPLANT
WATER STERILE IRR 1500ML POUR (IV SOLUTION) ×3 IMPLANT
WRAP KNEE MAXI GEL POST OP (GAUZE/BANDAGES/DRESSINGS) ×3 IMPLANT
YANKAUER SUCT BULB TIP 10FT TU (MISCELLANEOUS) ×3 IMPLANT

## 2015-04-11 NOTE — Interval H&P Note (Signed)
History and Physical Interval Note:  04/11/2015 12:36 PM  Rebekah Skinner  has presented today for surgery, with the diagnosis of right knee oa   The various methods of treatment have been discussed with the patient and family. After consideration of risks, benefits and other options for treatment, the patient has consented to  Procedure(s): TOTAL RIGHT KNEE ARTHROPLASTY (Right) as a surgical intervention .  The patient's history has been reviewed, patient examined, no change in status, stable for surgery.  I have reviewed the patient's chart and labs.  Questions were answered to the patient's satisfaction.     [name]

## 2015-04-11 NOTE — Anesthesia Procedure Notes (Addendum)
Anesthesia Regional Block:  Adductor canal block  Pre-Anesthetic Checklist: ,, timeout performed, Correct Patient, Correct Site, Correct Laterality, Correct Procedure, Correct Position, site marked, Risks and benefits discussed,  Surgical consent,  Pre-op evaluation,  At surgeon's request and post-op pain management  Laterality: Right  Prep: chloraprep       Needles:  Injection technique: Single-shot  Needle Type: Echogenic Stimulator Needle      Needle Gauge: 21 and 21 G    Additional Needles:  Procedures: ultrasound guided (picture in chart) Adductor canal block Narrative:  Injection made incrementally with aspirations every 5 mL.  Performed by: Personally  Anesthesiologist: Karie Schwalbe  Additional Notes: Patient tolerated the procedure well without complications   Procedure Name: Intubation Date/Time: 04/11/2015 12:53 PM Performed by: Jarvis Newcomer A Pre-anesthesia Checklist: Timeout performed, Patient identified, Emergency Drugs available and Patient being monitored Patient Re-evaluated:Patient Re-evaluated prior to inductionOxygen Delivery Method: Circle system utilized Preoxygenation: Pre-oxygenation with 100% oxygen Intubation Type: IV induction Ventilation: Mask ventilation without difficulty Laryngoscope Size: Mac and 4 Grade View: Grade I Tube type: Oral Tube size: 7.5 mm Number of attempts: 2 Airway Equipment and Method: Stylet Placement Confirmation: breath sounds checked- equal and bilateral,  ETT inserted through vocal cords under direct vision and positive ETCO2 Secured at: 22 cm Tube secured with: Tape Dental Injury: Teeth and Oropharynx as per pre-operative assessment

## 2015-04-11 NOTE — Progress Notes (Signed)
Utilization review completed.  

## 2015-04-11 NOTE — Brief Op Note (Signed)
04/11/2015  2:24 PM  PATIENT:  Rebekah Skinner  75 y.o. female  PRE-OPERATIVE DIAGNOSIS:  right knee Primary Osteoarthritis and Morbid Obesity and Flexion Contractures.  POST-OPERATIVE DIAGNOSIS:  Same as Pre-op  PROCEDURE:  Procedure(s): TOTAL RIGHT KNEE ARTHROPLASTY (Right) and release of Flexion Contractures.  SURGEON:  Surgeon(s) and Role:    * Ranee Gosselin, MD - Primary  PHYSICIAN ASSISTANT: Dimitri Ped PA  ASSISTANTS: Dimitri Ped PA  ANESTHESIA:   general  EBL:  Total I/O In: 1000 [I.V.:1000] Out: 100 [Urine:75; Blood:25]  BLOOD ADMINISTERED:none  DRAINS: (One) Hemovact drain(s) in the Right Knee with  Suction Open   LOCAL MEDICATIONS USED:  MARCAINE 20cc of 0.25%plain and 20cc of Exparel mixed with 20cc of Normal Saline.    SPECIMEN:  No Specimen  DISPOSITION OF SPECIMEN:  N/A  COUNTS:  YES  TOURNIQUET:  * Missing tourniquet times found for documented tourniquets in log:  [id number] *  DICTATION: .Other Dictation: Dictation Number [id number]  PLAN OF CARE: Admit to inpatient   PATIENT DISPOSITION:  Stable in OR   Delay start of Pharmacological VTE agent (>24hrs) due to surgical blood loss or risk of bleeding: yes

## 2015-04-11 NOTE — Anesthesia Postprocedure Evaluation (Signed)
Anesthesia Post Note  Patient: Rebekah Skinner  Procedure(s) Performed: Procedure(s) (LRB): TOTAL RIGHT KNEE ARTHROPLASTY (Right)  Patient location during evaluation: PACU Anesthesia Type: General Level of consciousness: awake and alert Pain management: pain level controlled Vital Signs Assessment: post-procedure vital signs reviewed and stable Respiratory status: spontaneous breathing, nonlabored ventilation, respiratory function stable and patient connected to nasal cannula oxygen Cardiovascular status: blood pressure returned to baseline and stable Postop Assessment: no signs of nausea or vomiting Anesthetic complications: no    Last Vitals:  Filed Vitals:   04/11/15 1515 04/11/15 1525  BP: 131/61   Pulse: 91 94  Temp:    Resp: 10 10    Last Pain:  Filed Vitals:   04/11/15 1525  PainSc: 8                  Archana Eckman JENNETTE

## 2015-04-11 NOTE — Transfer of Care (Signed)
Immediate Anesthesia Transfer of Care Note  Patient: Rebekah Skinner  Procedure(s) Performed: Procedure(s): TOTAL RIGHT KNEE ARTHROPLASTY (Right)  Patient Location: PACU  Anesthesia Type:General  Level of Consciousness: awake, alert , oriented and patient cooperative  Airway & Oxygen Therapy: Patient Spontanous Breathing and Patient connected to face mask oxygen  Post-op Assessment: Report given to RN, Post -op Vital signs reviewed and stable and Patient moving all extremities  Post vital signs: Reviewed and stable  Last Vitals:  Filed Vitals:   04/11/15 1031  BP: 130/77  Pulse: 97  Temp: 36.4 C  Resp: 18    Complications: No apparent anesthesia complications

## 2015-04-11 NOTE — Anesthesia Preprocedure Evaluation (Addendum)
Anesthesia Evaluation  Patient identified by MRN, date of birth, ID band Patient awake    Reviewed: Allergy & Precautions, NPO status , Patient's Chart, lab work & pertinent test results  History of Anesthesia Complications Negative for: history of anesthetic complications  Airway Mallampati: III  TM Distance: >3 FB Neck ROM: Full    Dental no notable dental hx. (+) Dental Advisory Given, Poor Dentition   Pulmonary former smoker,    Pulmonary exam normal breath sounds clear to auscultation       Cardiovascular + Past MI and + Cardiac Stents  Normal cardiovascular exam Rhythm:Regular Rate:Normal  Cardiac clearance on chart   Neuro/Psych negative neurological ROS  negative psych ROS   GI/Hepatic negative GI ROS, Neg liver ROS,   Endo/Other  negative endocrine ROS  Renal/GU negative Renal ROS  negative genitourinary   Musculoskeletal negative musculoskeletal ROS (+)   Abdominal   Peds negative pediatric ROS (+)  Hematology negative hematology ROS (+)   Anesthesia Other Findings   Reproductive/Obstetrics negative OB ROS                           Anesthesia Physical Anesthesia Plan  ASA: II  Anesthesia Plan: General   Post-op Pain Management: GA combined w/ Regional for post-op pain   Induction: Intravenous  Airway Management Planned: Oral ETT  Additional Equipment:   Intra-op Plan:   Post-operative Plan: Extubation in OR  Informed Consent: I have reviewed the patients History and Physical, chart, labs and discussed the procedure including the risks, benefits and alternatives for the proposed anesthesia with the patient or authorized representative who has indicated his/her understanding and acceptance.   Dental advisory given  Plan Discussed with: CRNA  Anesthesia Plan Comments:         Anesthesia Quick Evaluation

## 2015-04-12 LAB — BASIC METABOLIC PANEL
Anion gap: 8 (ref 5–15)
BUN: 19 mg/dL (ref 6–20)
CALCIUM: 8 mg/dL — AB (ref 8.9–10.3)
CO2: 22 mmol/L (ref 22–32)
Chloride: 106 mmol/L (ref 101–111)
Creatinine, Ser: 1.12 mg/dL — ABNORMAL HIGH (ref 0.44–1.00)
GFR calc Af Amer: 55 mL/min — ABNORMAL LOW (ref >60)
GFR calc non Af Amer: 48 mL/min — ABNORMAL LOW (ref >60)
Glucose, Bld: 135 mg/dL — ABNORMAL HIGH (ref 65–99)
Potassium: 4.7 mmol/L (ref 3.5–5.1)
SODIUM: 136 mmol/L (ref 135–145)

## 2015-04-12 LAB — CBC
HCT: 32.2 % — ABNORMAL LOW (ref 36.0–46.0)
Hemoglobin: 10.2 g/dL — ABNORMAL LOW (ref 12.0–15.0)
MCH: 31 pg (ref 26.0–34.0)
MCHC: 31.7 g/dL (ref 30.0–36.0)
MCV: 97.9 fL (ref 78.0–100.0)
PLATELETS: 231 10*3/uL (ref 150–400)
RBC: 3.29 MIL/uL — ABNORMAL LOW (ref 3.87–5.11)
RDW: 14.5 % (ref 11.5–15.5)
WBC: 11 10*3/uL — ABNORMAL HIGH (ref 4.0–10.5)

## 2015-04-12 MED ORDER — ASPIRIN 325 MG PO TABS: 325.0000 mg | ORAL_TABLET | Freq: Two times a day (BID) | ORAL | Status: DC

## 2015-04-12 MED ORDER — HYDROCODONE-ACETAMINOPHEN 5-325 MG PO TABS: 1.0000 | ORAL_TABLET | ORAL | Status: DC | PRN

## 2015-04-12 MED ORDER — METHOCARBAMOL 500 MG PO TABS: 500.0000 mg | ORAL_TABLET | Freq: Four times a day (QID) | ORAL | Status: DC | PRN

## 2015-04-12 NOTE — Progress Notes (Signed)
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  Pt received new RW and adjusted to her height.  Discussed transport home with son and pt, and how best to position her and which car would be easier for pt.  Con't to work with pt acutely with possible d/c home tomorrow.  Recommend HHPT.  Follow Up Recommendations  Home health PT     Equipment Recommendations  Rolling walker with 5" wheels (received already)    Recommendations for Other Services       Precautions / Restrictions Precautions Precautions: Knee;Fall Required Braces or Orthoses: Knee Immobilizer - Right Restrictions Weight Bearing Restrictions: Yes RLE Weight Bearing: Weight bearing as tolerated    Mobility  Bed Mobility Overal bed mobility: Needs Assistance Bed Mobility: Supine to Sit     Supine to sit: Supervision;HOB elevated Sit to supine: Min guard   General bed mobility comments: Difficulty getting R LE back into bed, but able without physical A  Transfers Overall transfer level: Needs assistance Equipment used: Rolling walker (2 wheeled) Transfers: Sit to/from Stand Sit to Stand: Supervision         General transfer comment: good recall of hand placement. stood from bed and low toilet  Ambulation/Gait Ambulation/Gait assistance: Min guard;Supervision Ambulation Distance (Feet): 175 Feet (plus gait in room to bathroom) Assistive device: Rolling walker (2 wheeled) Gait Pattern/deviations: Step-through pattern;Antalgic   Gait velocity interpretation: Below normal speed for age/gender General Gait Details: Educated on gait pattern and working on decreasing use of UE and putting more weight through R LE.   Stairs            Wheelchair  Mobility    Modified Rankin (Stroke Patients Only)       Balance           Standing balance support: During functional activity Standing balance-Leahy Scale: Fair Standing balance comment: Able to wash hands at sink and let go of RW                    Cognition Arousal/Alertness: Awake/alert Behavior During Therapy: WFL for tasks assessed/performed Overall Cognitive Status: Within Functional Limits for tasks assessed                      Exercises Total Joint Exercises Straight Leg Raises: Strengthening;Right;5 reps;Supine    General Comments General comments (skin integrity, edema, etc.): Discussed transport home with pt and son and which car may be best and positioning       Pertinent Vitals/Pain Pain Assessment: 0-10 Pain Score: 6  Pain Location: R knee Pain Descriptors / Indicators: Aching;Operative site guarding;Sore Pain Intervention(s): Limited activity within patient's tolerance;Monitored during session;Ice applied;Patient requesting pain meds-RN notified    Home Living Family/patient expects to be discharged to:: Private residence Living Arrangements: Alone Available Help at Discharge: Available PRN/intermittently;Family;Friend(s) Type of Home: House Home Access: Stairs to enter   Home Layout: One level;Laundry or work area in [employer] Equipment: Grab bars - tub/shower;Walker - standard;Hand held shower head;Bedside commode;Shower seat      Prior Function Level of Independence: Independent          PT Goals (current goals can now be found in the care plan section) Acute  Rehab PT Goals Patient Stated Goal: to go home PT Goal Formulation: With patient Time For Goal Achievement: 04/19/15 Potential to Achieve Goals: Good Progress towards PT goals: Progressing toward goals    Frequency  7X/week    PT Plan Current plan remains appropriate    Co-evaluation             End of Session Equipment Utilized During Treatment:  Gait belt Activity Tolerance: Patient tolerated treatment well Patient left: in chair;with call bell/phone within reach;with nursing/sitter in room;with family/visitor present     Time: 1349-1415 PT Time Calculation (min) (ACUTE ONLY): 26 min  Charges:  $Gait Training: 8-22 mins $Therapeutic Activity: 8-22 mins                    G Codes:      Lawson Mahone LUBECK 04/12/2015, 2:29 PM

## 2015-04-12 NOTE — Progress Notes (Signed)
Occupational Therapy Evaluation Patient Details Name: Rebekah Skinner MRN: [id number] DOB: 08-Nov-1940 Today's Date: 04/12/2015    History of Present Illness 75 y/o WF s/p R TKA (04/11/15) with PMH of ruptured AAA, cholecystectomy, R knee meniscus, stent to heart   Clinical Impression   Patient presents to OT with decreased ADL independence and safety s/p R TKA. She will benefit from skilled OT to maximize function and to facilitate a safe discharge. OT will follow.    Follow Up Recommendations  No OT follow up;Supervision/Assistance - 24 hour    Equipment Recommendations  None recommended by OT   Recommendations for Other Services       Precautions / Restrictions Precautions Precautions: Knee;Fall Required Braces or Orthoses: Knee Immobilizer - Right Restrictions Weight Bearing Restrictions: Yes RLE Weight Bearing: Weight bearing as tolerated      Mobility Bed Mobility               Transfers                 Balance                                        ADL Overall ADL's : Needs assistance/impaired     Grooming: Set up;Sitting       Lower Body Bathing: Minimal assistance;Sit to/from stand       Lower Body Dressing: Minimal assistance;Sit to/from stand   Toilet Transfer: Min guard;Ambulation;BSC;RW   Toileting- Architect and Hygiene: Min guard;Sit to/from stand         General ADL Comments: Patient received in bed, nurse tech at bedside had just assisted patient to and from bathroom. Patient reports she used RW to/from bathroom and had no difficulty with transfer/hygiene but nurse tech guarded her for safety. Patient reports she was too fatigued to get OOB again. Educated patient on role of OT, LB dressing/bathing techniques, and began education on tub transfer technique. Patient reports she would practice transfer tomorrow during OT session. OT will follow.     Vision     Perception     Praxis       Pertinent Vitals/Pain Pain Assessment: 0-10 Pain Score: 4  Pain Location: R knee with mobility Pain Descriptors / Indicators: Aching;Sore Pain Intervention(s): Limited activity within patient's tolerance;Monitored during session     Hand Dominance Right   Extremity/Trunk Assessment Upper Extremity Assessment Upper Extremity Assessment: Overall WFL for tasks assessed   Lower Extremity Assessment Lower Extremity Assessment: Defer to PT evaluation       Communication Communication Communication: No difficulties   Cognition Arousal/Alertness: Awake/alert Behavior During Therapy: WFL for tasks assessed/performed Overall Cognitive Status: Within Functional Limits for tasks assessed                     General Comments       Exercises       Shoulder Instructions      Home Living Family/patient expects to be discharged to:: Private residence Living Arrangements: Alone Available Help at Discharge: Available PRN/intermittently;Family;Friend(s) Type of Home: House Home Access: Stairs to enter [employer] of Steps: 1   Home Layout: One level;Laundry or work area in basement     [employer] Shower/Tub: Chief Strategy Officer: Handicapped height     Home Equipment: Grab bars - tub/shower;Walker - standard;Hand held shower head;Bedside commode;Shower seat  Prior Functioning/Environment Level of Independence: Independent             OT Diagnosis: Acute pain   OT Problem List: Decreased strength;Decreased range of motion;Decreased activity tolerance;Decreased knowledge of use of DME or AE;Pain   OT Treatment/Interventions: Self-care/ADL training;DME and/or AE instruction;Therapeutic activities;Patient/family education    OT Goals(Current goals can be found in the care plan section) Acute Rehab OT Goals Patient Stated Goal: to go home OT Goal Formulation: With patient Time For Goal Achievement: 04/26/15 Potential to  Achieve Goals: Good  OT Frequency: Min 2X/week   Barriers to D/C:            Co-evaluation              End of Session    Activity Tolerance: Patient tolerated treatment well Patient left: in bed;with call bell/phone within reach;with family/visitor present   Time: 2130-8657 OT Time Calculation (min): 18 min Charges:  OT General Charges $OT Visit: 1 Procedure OT Evaluation $OT Eval Moderate Complexity: 1 Procedure G-Codes:    Kaysen Sefcik A 2015/04/21, 12:59 PM

## 2015-04-12 NOTE — Care Management Note (Signed)
Case Management Note  Patient Details  Name: Rebekah Skinner MRN: [id number] Date of Birth: Nov 05, 1940  Subjective/Objective:   s/p R TKA                  Action/Plan: Discharge planning, spoke with patient and spouse at beside. Chose [facility] in [place] for [employer], contacted [facility] at [phone] for referral. Needs RW, contacted [facility] to deliver to room.   Expected Discharge Date:                  Expected Discharge Plan:  Home w Home Health Services  In-House Referral:  NA  Discharge planning Services  CM Consult  Post Acute Care Choice:  Home Health, Durable Medical Equipment Choice offered to:  Patient  DME Arranged:  Walker rolling DME Agency:  [facility]  HH Arranged:  PT [facility] Agency:  [facility]  Status of Service:  Completed, signed off  Medicare Important Message Given:    Date Medicare IM Given:    Medicare IM give by:    Date Additional Medicare IM Given:    Additional Medicare Important Message give by:     If discussed at Long Length of Stay Meetings, dates discussed:    Additional Comments:  Alexis Goodell, RN 04/12/2015, 11:13 AM [phone]

## 2015-04-12 NOTE — Progress Notes (Signed)
Subjective: 1 Day Post-Op Procedure(s) (LRB): TOTAL RIGHT KNEE ARTHROPLASTY (Right) Patient reports pain as 1 on 0-10 scale.  Doing very well. Hemovac DCd. Plan on DC tomorrow.  Objective: Vital signs in last 24 hours: Temp:  [97.4 F (36.3 C)-98.5 F (36.9 C)] 98.5 F (36.9 C) (02/01 0507) Pulse Rate:  [78-97] 78 (02/01 0507) Resp:  [10-18] 16 (02/01 0507) BP: (93-153)/(53-86) 97/53 mmHg (02/01 0507) SpO2:  [90 %-100 %] 100 % (02/01 0507) Weight:  [96.163 kg (212 lb)] 96.163 kg (212 lb) (01/31 1056)  Intake/Output from previous day: 01/31 0701 - 02/01 0700 In: 2940 [I.V.:2890; IV Piggyback:50] Out: 720 [Urine:575; Drains:120; Blood:25] Intake/Output this shift:     Recent Labs  04/12/15 0519  HGB 10.2*    Recent Labs  04/12/15 0519  WBC 11.0*  RBC 3.29*  HCT 32.2*  PLT 231    Recent Labs  04/12/15 0519  NA 136  K 4.7  CL 106  CO2 22  BUN 19  CREATININE 1.12*  GLUCOSE 135*  CALCIUM 8.0*   No results for input(s): LABPT, INR in the last 72 hours.  Dorsiflexion/Plantar flexion intact No cellulitis present Compartment soft  Assessment/Plan: 1 Day Post-Op Procedure(s) (LRB): TOTAL RIGHT KNEE ARTHROPLASTY (Right) Up with therapy  Lakeva Hollon A 04/12/2015, 7:41 AM

## 2015-04-12 NOTE — Discharge Instructions (Signed)
INSTRUCTIONS AFTER JOINT REPLACEMENT   o Remove items at home which could result in a fall. This includes throw rugs or furniture in walking pathways o ICE to the affected joint every three hours while awake for 30 minutes at a time, for at least the first 3-5 days, and then as needed for pain and swelling.  Continue to use ice for pain and swelling. You may notice swelling that will progress down to the foot and ankle.  This is normal after surgery.  Elevate your leg when you are not up walking on it.   o Continue to use the breathing machine you got in the hospital (incentive spirometer) which will help keep your temperature down.  It is common for your temperature to cycle up and down following surgery, especially at night when you are not up moving around and exerting yourself.  The breathing machine keeps your lungs expanded and your temperature down.   DIET:  As you were doing prior to hospitalization, we recommend a well-balanced diet.  DRESSING / WOUND CARE / SHOWERING  You may change your dressing daily after surgery  with sterile gauze.  Please use good hand washing techniques before changing the dressing.  Do not use any lotions or creams on the incision until instructed by your surgeon.  ACTIVITY  o Increase activity slowly as tolerated, but follow the weight bearing instructions below.   o No driving for 6 weeks or until further direction given by your physician.  You cannot drive while taking narcotics.  o No lifting or carrying greater than 10 lbs. until further directed by your surgeon. o Avoid periods of inactivity such as sitting longer than an hour when not asleep. This helps prevent blood clots.  o You may return to work once you are authorized by your doctor.     WEIGHT BEARING   Weight bearing as tolerated with assist device (walker, cane, etc) as directed, use it as long as suggested by your surgeon or therapist, typically at least 4-6 weeks.   EXERCISES  Results  after joint replacement surgery are often greatly improved when you follow the exercise, range of motion and muscle strengthening exercises prescribed by your doctor. Safety measures are also important to protect the joint from further injury. Any time any of these exercises cause you to have increased pain or swelling, decrease what you are doing until you are comfortable again and then slowly increase them. If you have problems or questions, call your caregiver or physical therapist for advice.   Rehabilitation is important following a joint replacement. After just a few days of immobilization, the muscles of the leg can become weakened and shrink (atrophy).  These exercises are designed to build up the tone and strength of the thigh and leg muscles and to improve motion. Often times heat used for twenty to thirty minutes before working out will loosen up your tissues and help with improving the range of motion but do not use heat for the first two weeks following surgery (sometimes heat can increase post-operative swelling).   These exercises can be done on a training (exercise) mat, on the floor, on a table or on a bed. Use whatever works the best and is most comfortable for you.    Use music or television while you are exercising so that the exercises are a pleasant break in your day. This will make your life better with the exercises acting as a break in your routine that you can look  forward to.   Perform all exercises about fifteen times, three times per day or as directed.  You should exercise both the operative leg and the other leg as well.  Exercises include:    Quad Sets - Tighten up the muscle on the front of the thigh (Quad) and hold for 5-10 seconds.    Straight Leg Raises - With your knee straight (if you were given a brace, keep it on), lift the leg to 60 degrees, hold for 3 seconds, and slowly lower the leg.  Perform this exercise against resistance later as your leg gets stronger.    Leg Slides: Lying on your back, slowly slide your foot toward your buttocks, bending your knee up off the floor (only go as far as is comfortable). Then slowly slide your foot back down until your leg is flat on the floor again.   Angel Wings: Lying on your back spread your legs to the side as far apart as you can without causing discomfort.   Hamstring Strength:  Lying on your back, push your heel against the floor with your leg straight by tightening up the muscles of your buttocks.  Repeat, but this time bend your knee to a comfortable angle, and push your heel against the floor.  You may put a pillow under the heel to make it more comfortable if necessary.   A rehabilitation program following joint replacement surgery can speed recovery and prevent re-injury in the future due to weakened muscles. Contact your doctor or a physical therapist for more information on knee rehabilitation.    CONSTIPATION  Constipation is defined medically as fewer than three stools per week and severe constipation as less than one stool per week.  Even if you have a regular bowel pattern at home, your normal regimen is likely to be disrupted due to multiple reasons following surgery.  Combination of anesthesia, postoperative narcotics, change in appetite and fluid intake all can affect your bowels.   YOU MUST use at least one of the following options; they are listed in order of increasing strength to get the job done.  They are all available over the counter, and you may need to use some, POSSIBLY even all of these options:    Drink plenty of fluids (prune juice may be helpful) and high fiber foods Colace 100 mg by mouth twice a day  Senokot for constipation as directed and as needed Dulcolax (bisacodyl), take with full glass of water  Miralax (polyethylene glycol) once or twice a day as needed.  If you have tried all these things and are unable to have a bowel movement in the first 3-4 days after surgery  call either your surgeon or your primary doctor.    If you experience loose stools or diarrhea, hold the medications until you stool forms back up.  If your symptoms do not get better within 1 week or if they get worse, check with your doctor.  If you experience "the worst abdominal pain ever" or develop nausea or vomiting, please contact the office immediately for further recommendations for treatment.   ITCHING:  If you experience itching with your medications, try taking only a single pain pill, or even half a pain pill at a time.  You can also use Benadryl over the counter for itching or also to help with sleep.   TED HOSE STOCKINGS:  Use stockings on both legs until for at least 2 weeks or as directed by physician office. They may be  removed at night for sleeping.  MEDICATIONS:  See your medication summary on the After Visit Summary that nursing will review with you.  You may have some home medications which will be placed on hold until you complete the course of blood thinner medication.  It is important for you to complete the blood thinner medication as prescribed.  PRECAUTIONS:  If you experience chest pain or shortness of breath - call 911 immediately for transfer to the hospital emergency department.   If you develop a fever greater that 101 F, purulent drainage from wound, increased redness or drainage from wound, foul odor from the wound/dressing, or calf pain - CONTACT YOUR SURGEON.                                                   FOLLOW-UP APPOINTMENTS:  If you do not already have a post-op appointment, please call the office for an appointment to be seen by your surgeon.  Guidelines for how soon to be seen are listed in your After Visit Summary, but are typically between 1-4 weeks after surgery.  OTHER INSTRUCTIONS:  Take aspirin  twice daily to prevent blood clots  MAKE SURE YOU:   Understand these instructions.   Get help right away if you are not doing well or get  worse.    Thank you for letting us be a part of your medical care team.  It is a privilege we respect greatly.  We hope these instructions will help you stay on track for a fast and full recovery!

## 2015-04-12 NOTE — Op Note (Signed)
NAMEHANSIKA, LEAMING                ACCOUNT NO.:  [id number]  MEDICAL RECORD NO.:  [id number]  LOCATION:  1525                         FACILITY:  [facility]  PHYSICIAN:  Georges Lynch. Cameryn Schum, M.D.DATE OF BIRTH:  04-Sep-1940  DATE OF PROCEDURE:  04/11/2015 DATE OF DISCHARGE:                              OPERATIVE REPORT   SURGEON:  Georges Lynch. Darrelyn Hillock, M.D.  ASSISTANT:  Dimitri Ped, [place].  PREOPERATIVE DIAGNOSES: 1. Morbid obesity. 2. Flexion contracture, right knee. 3. 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  POSTOPERATIVE DIAGNOSES: 1. Morbid obesity. 2. Flexion contracture, right knee. 3. 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  OPERATION:  Right total knee arthroplasty utilizing DePuy system.  All 3 components were cemented and gentamicin was used in the cement.  The sizes used was a size 3 right posterior cruciate sacrificing femoral component, the patella with a size 35 with 3 pegs, tibial tray was a size 2.5.  The insert was a size 3, 10-mm thickness polyethylene rotating platform insert.  DESCRIPTION OF PROCEDURE:  Under general anesthesia, routine orthopedic prep and draping of the right lower extremity was carried out.  I marked the appropriate right leg in the holding area.  The appropriate time-out was first carried out.  The tourniquet was elevated to 325 mm after we exsanguinated the leg with an Esmarch.  At this time, the right leg was placed in a DeMayo knee holder.  The anterior approach of the knee was carried out in usual fashion.  The patient had 2 g of IV Ancef and she did receive topical tranexamic acid.  At this time, 2 flaps were created.  I then carried out a median parapatellar approach.  Patella was reflected laterally.  Self-retaining retractors were inserted.  I then did medial and lateral meniscectomies and excised the anterior, posterior, and cruciate ligaments.  Initial drill hole then was made in the  intercondylar notch.  The guide rod was inserted up the canal and we then thoroughly irrigated out the canal.  I then removed 12 mm thickness off the distal femur.  At this time, next attention was directed to the tibia plateau, was measured to be 2.5.  Initial drill hole was made in the tibial plateau.  Note, this was done after we did anterior, posterior, and chamfering cuts obviously on the distal femur.  That was done for a size 3 femur.  Attention then was directed to the tibial plateau.  Initial drill hole was made in the midline of the plateau. Following that, the guide rod was inserted.  I removed and then thoroughly water picked out the knee.  At that particular time, we removed 6 mm thickness off the affected lateral side of the tibia.  We then went on and inserted our lamina spreaders and we had good removal of the spurs.  The spacer blocks then were inserted.  We felt that a 10.5 spacer blocks in this area was most stable.  We then removed the spacer blocks, irrigated the knee, and then cut our keel cut out of tibial plateau in the usual fashion followed by the notch cut out of the distal femur.  At this time, trial components were inserted.  Knee was reduced.  We then did a resurfacing procedure on the patella in the usual fashion.  We utilized a 35 mm patella.  Three drill holes were made in the articular surface of the patella.  At this time, we then removed all trial components.  After testing the knee in flexion and extension, thoroughly water picked out the knee and cemented all 3 components in simultaneously.  At the end of the procedure, the loose pieces of cement were removed, water picked out the knee as well, made sure there were no other loose fragments.  After this, I injected 20 mL of 0.25% plain Marcaine into the soft tissues.  We then inserted our permanent rotating platform, 10 mm thickness size 3 reduced the knee, had good flexion and extension, and had good  medial and lateral stability.  At this time, Hemovac drain was inserted and the knee was closed in layers in the usual fashion.  Note, all 3 components were cemented and I measured dimension with gentamicin in the cement. Sterile dressings were applied.  At the end of the procedure, the patient also received 20 mL of Exparel with 20 mL of normal saline along with the topical tranexamic acid.          ______________________________ Georges Lynch. Darrelyn Hillock, M.D.     RAG/MEDQ  D:  04/11/2015  T:  04/11/2015  Job:  [id number]

## 2015-04-12 NOTE — Progress Notes (Signed)
CSW received referral for SNF.   CSW reviewed chart and noted PT recommending Home Health PT and pt plans for d/c home.   Inappropriate CSW referral.  CSW signing off.   Loletta Specter, MSW, LCSW Clinical Social Work [phone]

## 2015-04-12 NOTE — Evaluation (Signed)
Physical Therapy Evaluation Patient Details Name: Rebekah Skinner MRN: [id number] DOB: Nov 12, 1940 Today's Date: 04/12/2015   History of Present Illness  75 y/o WF s/p R TKA (04/11/15) with PMH of ruptured AAA, cholecystectomy, R knee meniscus, stent to heart  Clinical Impression  Pt admitted with above diagnosis. Pt currently with functional limitations due to the deficits listed below (see PT Problem List).  Pt will benefit from skilled PT to increase their independence and safety with mobility to allow discharge to the venue listed below.  Pt did well at eval ambulating with RW 95' with min/guard.  Pt will need RW and HHPT.  Spoke with son about A at home. Anticipate continued progress, but would need help initially and would not be able to do laundry at this time in basement.      Follow Up Recommendations Home health PT    Equipment Recommendations  Rolling walker with 5" wheels    Recommendations for Other Services       Precautions / Restrictions Restrictions Weight Bearing Restrictions: Yes RLE Weight Bearing: Weight bearing as tolerated      Mobility  Bed Mobility Overal bed mobility: Needs Assistance Bed Mobility: Supine to Sit     Supine to sit: Supervision;HOB elevated        Transfers Overall transfer level: Needs assistance   Transfers: Sit to/from Stand;Stand Pivot Transfers Sit to Stand: Supervision Stand pivot transfers: Min guard       General transfer comment: cues for hand placement. transferred bed <> [facility]  Ambulation/Gait Ambulation/Gait assistance: Min guard Ambulation Distance (Feet): 95 Feet Assistive device: Rolling walker (2 wheeled) Gait Pattern/deviations: Step-to pattern;Antalgic   Gait velocity interpretation: Below normal speed for age/gender General Gait Details: Cues for proper step length and walker placement as pt placing too far in front.   Stairs            Wheelchair Mobility    Modified Rankin (Stroke Patients Only)        Balance Overall balance assessment: Needs assistance         Standing balance support: Single extremity supported Standing balance-Leahy Scale: Fair Standing balance comment: Pt able to stand with unilateral support                             Pertinent Vitals/Pain Pain Assessment: 0-10 Pain Score: 7  Pain Location: R knee only with walking and knee flexion Pain Descriptors / Indicators: Operative site guarding;Grimacing Pain Intervention(s): Limited activity within patient's tolerance;Monitored during session;Repositioned;Ice applied    Home Living Family/patient expects to be discharged to:: Private residence Living Arrangements: Alone Available Help at Discharge: Available PRN/intermittently;Family;Friend(s) Type of Home: House Home Access: Stairs to enter   [employer] of Steps: 1 Home Layout: One level;Laundry or work area in [employer] Equipment: Grab bars - tub/shower;Walker - standard;Bedside commode;Shower seat      Prior Function Level of Independence: Independent               Hand Dominance   Dominant Hand: Right    Extremity/Trunk Assessment   Upper Extremity Assessment: Defer to OT evaluation           Lower Extremity Assessment: Overall WFL for tasks assessed;RLE deficits/detail RLE Deficits / Details: Fair quad set with R knee AROM -10 to ~ 45 degrees    Cervical / Trunk Assessment: Normal  Communication   Communication: No difficulties  Cognition Arousal/Alertness: Awake/alert Behavior During Therapy: [facility]  for tasks assessed/performed Overall Cognitive Status: Within Functional Limits for tasks assessed                      General Comments General comments (skin integrity, edema, etc.): Son in at end of session.    Exercises Total Joint Exercises Ankle Circles/Pumps: AROM;Both;10 reps;Seated Quad Sets: Strengthening;Both;10 reps;Supine Heel Slides: AROM;Right;10 reps;Supine       Assessment/Plan    PT Assessment Patient needs continued PT services  PT Diagnosis Difficulty walking   PT Problem List Decreased strength;Decreased range of motion;Decreased balance;Decreased mobility;Decreased knowledge of use of DME  PT Treatment Interventions DME instruction;Gait training;Functional mobility training;Stair training;Therapeutic activities;Therapeutic exercise;Balance training   PT Goals (Current goals can be found in the Care Plan section) Acute Rehab PT Goals Patient Stated Goal: to go home PT Goal Formulation: With patient Time For Goal Achievement: 04/19/15 Potential to Achieve Goals: Good    Frequency 7X/week   Barriers to discharge Decreased caregiver support      Co-evaluation               End of Session Equipment Utilized During Treatment: Gait belt Activity Tolerance: Patient tolerated treatment well Patient left: in chair;with call bell/phone within reach;with family/visitor present Nurse Communication: Mobility status         Time: 1610-9604 PT Time Calculation (min) (ACUTE ONLY): 31 min   Charges:   PT Evaluation $PT Eval Moderate Complexity: 1 Procedure PT Treatments $Gait Training: 8-22 mins   PT G Codes:        Rebekah Skinner 04/12/2015, 9:55 AM

## 2015-04-13 LAB — BASIC METABOLIC PANEL
ANION GAP: 6 (ref 5–15)
BUN: 26 mg/dL — ABNORMAL HIGH (ref 6–20)
CO2: 26 mmol/L (ref 22–32)
Calcium: 8 mg/dL — ABNORMAL LOW (ref 8.9–10.3)
Chloride: 108 mmol/L (ref 101–111)
Creatinine, Ser: 1.12 mg/dL — ABNORMAL HIGH (ref 0.44–1.00)
GFR, EST AFRICAN AMERICAN: 55 mL/min — AB (ref >60)
GFR, EST NON AFRICAN AMERICAN: 48 mL/min — AB (ref >60)
GLUCOSE: 130 mg/dL — AB (ref 65–99)
POTASSIUM: 4.9 mmol/L (ref 3.5–5.1)
Sodium: 140 mmol/L (ref 135–145)

## 2015-04-13 LAB — CBC
HEMATOCRIT: 29.7 % — AB (ref 36.0–46.0)
Hemoglobin: 9 g/dL — ABNORMAL LOW (ref 12.0–15.0)
MCH: 30.4 pg (ref 26.0–34.0)
MCHC: 30.3 g/dL (ref 30.0–36.0)
MCV: 100.3 fL — AB (ref 78.0–100.0)
PLATELETS: 202 10*3/uL (ref 150–400)
RBC: 2.96 MIL/uL — AB (ref 3.87–5.11)
RDW: 14.9 % (ref 11.5–15.5)
WBC: 10.9 10*3/uL — AB (ref 4.0–10.5)

## 2015-04-13 MED ORDER — OXYCODONE-ACETAMINOPHEN 5-325 MG PO TABS: 1.0000 | ORAL_TABLET | ORAL | Status: DC | PRN

## 2015-04-13 NOTE — Progress Notes (Signed)
Occupational Therapy Treatment Patient Details Name: Rebekah Skinner MRN: [id number] DOB: 08-14-1940 Today's Date: 04/13/2015    History of present illness 75 y/o WF s/p R TKA (04/11/15) with PMH of ruptured AAA, cholecystectomy, R knee meniscus, stent to heart   OT comments  All OT education completed and patient will d/c home with family assistance today. OT will sign off.  Follow Up Recommendations  No OT follow up;Supervision/Assistance - 24 hour    Equipment Recommendations  None recommended by OT    Recommendations for Other Services      Precautions / Restrictions Precautions Precautions: Knee;Fall Restrictions Weight Bearing Restrictions: Yes RLE Weight Bearing: Weight bearing as tolerated       Mobility Bed Mobility Overal bed mobility: Needs Assistance Bed Mobility: Sit to Supine       Sit to supine: Min guard   General bed mobility comments: Difficulty getting R LE back into bed, but able without physical A  Transfers Overall transfer level: Needs assistance Equipment used: Rolling walker (2 wheeled) Transfers: Sit to/from Stand Sit to Stand: Supervision         General transfer comment: good recall of hand placement. stood from bed and low toilet    Balance                                   ADL Overall ADL's : Needs assistance/impaired Eating/Feeding: Independent;Sitting   Grooming: Wash/dry hands;Wash/dry face;Supervision/safety;Standing   Upper Body Bathing: Supervision/ safety;Standing   Lower Body Bathing: Supervison/ safety;Sit to/from stand   Upper Body Dressing : Supervision/safety;Standing   Lower Body Dressing: Minimal assistance;Sit to/from stand   Toilet Transfer: Supervision/safety;Ambulation;Comfort height toilet;RW   Toileting- Clothing Manipulation and Hygiene: Supervision/safety;Sit to/from stand       Functional mobility during ADLs: Supervision/safety;Rolling walker General ADL Comments: Patient received in  bathroom with nursing; finishing up toileting task. Patient willing to work with OT. Performed bathing at sink in standing, then dressing sit to stand from edge of bed. Also performed grooming in standing at sink. Patient educated on LB ADL techniques and still requires min A to get pant leg over RLE and R sock/shoe on. She will have family assistance with this task. Patient educated on tub transfer technique with 2 grab bars. Patient able to verbalize safe technique back to me. States she likely will not get in the shower initially and will have family with her when she does. All education completed.      Vision                     Perception     Praxis      Cognition   Behavior During Therapy: WFL for tasks assessed/performed Overall Cognitive Status: Within Functional Limits for tasks assessed                       Extremity/Trunk Assessment               Exercises     Shoulder Instructions       General Comments      Pertinent Vitals/ Pain       Pain Assessment: 0-10 Pain Score: 5  Pain Location: R knee Pain Descriptors / Indicators: Aching;Sore Pain Intervention(s): Limited activity within patient's tolerance;Monitored during session;Repositioned  Home Living  Prior Functioning/Environment              Frequency       Progress Toward Goals  OT Goals(current goals can now be found in the care plan section)  Progress towards OT goals: Goals met/education completed, patient discharged from [facility] All goals met and education completed, patient discharged from OT services    Co-evaluation                 End of Session Equipment Utilized During Treatment: Rolling walker   Activity Tolerance Patient tolerated treatment well   Patient Left in bed;with call bell/phone within reach   Nurse Communication          Time: 0211-1735 OT Time Calculation (min): 41  min  Charges: OT General Charges $OT Visit: 1 Procedure OT Treatments $Self Care/Home Management : 38-52 mins  Rebekah Skinner A 04/13/2015, 10:26 AM

## 2015-04-13 NOTE — Progress Notes (Signed)
RN reviewed discharge instructions with patient and family. All questions answered.  Paperwork and prescriptions given.   NT rolled patient down in wheelchair to family car.  

## 2015-04-13 NOTE — Progress Notes (Signed)
Physical Therapy Treatment Patient Details Name: Rebekah Skinner MRN: [id number] DOB: 1941/02/15 Today's Date: 04/13/2015    History of Present Illness 75 y/o WF s/p R TKA (04/11/15) with PMH of ruptured AAA, cholecystectomy, R knee meniscus, stent to heart    PT Comments    POD # 2  Pt able to perform active SLR so instructed no longer need to wear KI.  Practiced going up and down one step twicwe with walker.  Performed all supine TKR TE's followed by ICE.  All mobility querstions addressed.  Pt ready for D/C to home.   Follow Up Recommendations  Home health PT     Equipment Recommendations  Rolling walker with 5" wheels    Recommendations for Other Services       Precautions / Restrictions Precautions Precautions: Knee;Fall Precaution Comments: pr able to perform active SLR so D/C KI Restrictions Weight Bearing Restrictions: No RLE Weight Bearing: Weight bearing as tolerated    Mobility  Bed Mobility Overal bed mobility: Needs Assistance Bed Mobility: Supine to Sit;Sit to Supine     Supine to sit: Supervision Sit to supine: Supervision   General bed mobility comments: increased time  Transfers Overall transfer level: Needs assistance Equipment used: Rolling walker (2 wheeled) Transfers: Sit to/from Stand Sit to Stand: Supervision Stand pivot transfers: Supervision       General transfer comment: good safety cognition and use of hands to steady self   Ambulation/Gait Ambulation/Gait assistance: Supervision Ambulation Distance (Feet): 140 Feet Assistive device: Rolling walker (2 wheeled) Gait Pattern/deviations: Step-to pattern Gait velocity: decreased   General Gait Details: 25% VC's for upright posture and to decreased WBing thru B UE's on walker    Stairs Stairs: Yes Stairs assistance: Min guard Stair Management: No rails;Step to pattern;Forwards;With walker Number of Stairs: 1 General stair comments: one step forward with walker and < 25% VC's on  proper sequencing and walker placement.  Performed twice.   Wheelchair Mobility    Modified Rankin (Stroke Patients Only)       Balance                                    Cognition Arousal/Alertness: Awake/alert Behavior During Therapy: WFL for tasks assessed/performed Overall Cognitive Status: Within Functional Limits for tasks assessed                      Exercises   Total Knee Replacement TE's 10 reps B LE ankle pumps 10 reps towel squeezes 10 reps knee presses 10 reps heel slides  10 reps SAQ's 10 reps SLR's 10 reps ABD Followed by ICE     General Comments        Pertinent Vitals/Pain Pain Assessment: 0-10 Pain Score: 5  Pain Location: R knee with TE's Pain Descriptors / Indicators: Sore;Grimacing;Tender Pain Intervention(s): Monitored during session;Premedicated before session;Repositioned;Ice applied    Home Living                      Prior Function            PT Goals (current goals can now be found in the care plan section) Progress towards PT goals: Progressing toward goals    Frequency  7X/week    PT Plan Current plan remains appropriate    Co-evaluation             End of Session Equipment Utilized During  Treatment: Gait belt Activity Tolerance: Patient tolerated treatment well Patient left: in bed;with call bell/phone within reach;with family/visitor present     Time: 1610-9604 PT Time Calculation (min) (ACUTE ONLY): 40 min  Charges:  $Gait Training: 8-22 mins $Therapeutic Exercise: 8-22 mins $Therapeutic Activity: 8-22 mins                    G Codes:      Felecia Shelling  PTA [facility]  Acute  Rehab Pager      [phone]

## 2015-04-13 NOTE — Progress Notes (Signed)
Subjective: 2 Days Post-Op Procedure(s) (LRB): TOTAL RIGHT KNEE ARTHROPLASTY (Right) Patient reports pain as 1 on 0-10 scale.Will DC today. She is doing very well.HBg 9.0    Objective: Vital signs in last 24 hours: Temp:  [97.8 F (36.6 C)-98.9 F (37.2 C)] 98 F (36.7 C) (02/02 0607) Pulse Rate:  [81-89] 81 (02/02 0607) Resp:  [16-18] 16 (02/02 0607) BP: (104-135)/(54-60) 104/54 mmHg (02/02 0607) SpO2:  [90 %-96 %] 93 % (02/02 0607)  Intake/Output from previous day: 02/01 0701 - 02/02 0700 In: 2880 [P.O.:480; I.V.:2400] Out: 500 [Urine:500] Intake/Output this shift:     Recent Labs  04/12/15 0519 04/13/15 0426  HGB 10.2* 9.0*    Recent Labs  04/12/15 0519 04/13/15 0426  WBC 11.0* 10.9*  RBC 3.29* 2.96*  HCT 32.2* 29.7*  PLT 231 202    Recent Labs  04/12/15 0519 04/13/15 0426  NA 136 140  K 4.7 4.9  CL 106 108  CO2 22 26  BUN 19 26*  CREATININE 1.12* 1.12*  GLUCOSE 135* 130*  CALCIUM 8.0* 8.0*   No results for input(s): LABPT, INR in the last 72 hours.  Neurologically intact No cellulitis present  Assessment/Plan: 2 Days Post-Op Procedure(s) (LRB): TOTAL RIGHT KNEE ARTHROPLASTY (Right) Up with therapy Discharge home with home health  Raliegh Scobie A 04/13/2015, 7:25 AM

## 2015-04-13 NOTE — Progress Notes (Signed)
[facility]  Patient Status: New  [facility] is providing the following services: rolling walker - delivered to room  If patient discharges after hours, please call [phone].   Sherryll Burger 04/13/2015, 10:11 AM

## 2015-04-14 NOTE — Discharge Summary (Signed)
Physician Discharge Summary   Patient ID: Rebekah Skinner MRN: [id number] DOB/AGE: [birth year] 75 y.o.  Admit date: 04/11/2015 Discharge date: 04/13/2015  Primary Diagnosis: Primary osteoarthritis right knee  Admission Diagnoses:  Past Medical History  Diagnosis Date  . Myocardial infarction [facility]) 2002   Discharge Diagnoses:   Active Problems:   History of total knee arthroplasty  Estimated body mass index is 34.73 kg/(m^2) as calculated from the following:   Height as of this encounter: 5' 5.5" (1.664 m).   Weight as of this encounter: 96.163 kg (212 lb).  Procedure:  Procedure(s) (LRB): TOTAL RIGHT KNEE ARTHROPLASTY (Right)   Consults: None  HPI: Rebekah Skinner, 75 y.o. female, has a history of pain and functional disability in the right knee due to arthritis and has failed non-surgical conservative treatments for greater than 12 weeks to includeNSAID's and/or analgesics, corticosteriod injections, flexibility and strengthening excercises and activity modification. Onset of symptoms was gradual, starting 5 years ago with gradually worsening course since that time. The patient noted prior procedures on the knee to include arthroscopy and menisectomy on the right knee(s). Patient currently rates pain in the right knee(s) at 7 out of 10 with activity. Patient has night pain, worsening of pain with activity and weight bearing, pain that interferes with activities of daily living, pain with passive range of motion, crepitus and joint swelling. Patient has evidence of periarticular osteophytes and joint space narrowing by imaging studies. There is no active infection.  Laboratory Data: Admission on 04/11/2015, Discharged on 04/13/2015  Component Date Value Ref Range Status  . WBC 04/12/2015 11.0* 4.0 - 10.5 K/uL Final  . RBC 04/12/2015 3.29* 3.87 - 5.11 MIL/uL Final  . Hemoglobin 04/12/2015 10.2* 12.0 - 15.0 g/dL Final  . HCT 04/12/2015 32.2* 36.0 - 46.0 % Final  . MCV 04/12/2015 97.9   78.0 - 100.0 fL Final  . MCH 04/12/2015 31.0  26.0 - 34.0 pg Final  . MCHC 04/12/2015 31.7  30.0 - 36.0 g/dL Final  . RDW 04/12/2015 14.5  11.5 - 15.5 % Final  . Platelets 04/12/2015 231  150 - 400 K/uL Final  . Sodium 04/12/2015 136  135 - 145 mmol/L Final  . Potassium 04/12/2015 4.7  3.5 - 5.1 mmol/L Final  . Chloride 04/12/2015 106  101 - 111 mmol/L Final  . CO2 04/12/2015 22  22 - 32 mmol/L Final  . Glucose, Bld 04/12/2015 135* 65 - 99 mg/dL Final  . BUN 04/12/2015 19  6 - 20 mg/dL Final  . Creatinine, Ser 04/12/2015 1.12* 0.44 - 1.00 mg/dL Final  . Calcium 04/12/2015 8.0* 8.9 - 10.3 mg/dL Final  . GFR calc non Af Amer 04/12/2015 48* >60 mL/min Final  . GFR calc Af Amer 04/12/2015 55* >60 mL/min Final   Comment: (NOTE) The eGFR has been calculated using the CKD EPI equation. This calculation has not been validated in all clinical situations. eGFR's persistently <60 mL/min signify possible Chronic Kidney Disease.   . Anion gap 04/12/2015 8  5 - 15 Final  . WBC 04/13/2015 10.9* 4.0 - 10.5 K/uL Final  . RBC 04/13/2015 2.96* 3.87 - 5.11 MIL/uL Final  . Hemoglobin 04/13/2015 9.0* 12.0 - 15.0 g/dL Final  . HCT 04/13/2015 29.7* 36.0 - 46.0 % Final  . MCV 04/13/2015 100.3* 78.0 - 100.0 fL Final  . MCH 04/13/2015 30.4  26.0 - 34.0 pg Final  . MCHC 04/13/2015 30.3  30.0 - 36.0 g/dL Final  . RDW 04/13/2015 14.9  11.5 - 15.5 %  Final  . Platelets 04/13/2015 202  150 - 400 K/uL Final  . Sodium 04/13/2015 140  135 - 145 mmol/L Final  . Potassium 04/13/2015 4.9  3.5 - 5.1 mmol/L Final  . Chloride 04/13/2015 108  101 - 111 mmol/L Final  . CO2 04/13/2015 26  22 - 32 mmol/L Final  . Glucose, Bld 04/13/2015 130* 65 - 99 mg/dL Final  . BUN 04/13/2015 26* 6 - 20 mg/dL Final  . Creatinine, Ser 04/13/2015 1.12* 0.44 - 1.00 mg/dL Final  . Calcium 04/13/2015 8.0* 8.9 - 10.3 mg/dL Final  . GFR calc non Af Amer 04/13/2015 48* >60 mL/min Final  . GFR calc Af Amer 04/13/2015 55* >60 mL/min Final    Comment: (NOTE) The eGFR has been calculated using the CKD EPI equation. This calculation has not been validated in all clinical situations. eGFR's persistently <60 mL/min signify possible Chronic Kidney Disease.   [name] gap 04/13/2015 6  5 - 15 Final  Hospital Outpatient Visit on 04/07/2015  Component Date Value Ref Range Status  . aPTT 04/07/2015 22* 24 - 37 seconds Final  . WBC 04/07/2015 11.6* 4.0 - 10.5 K/uL Final   WHITE COUNT CONFIRMED ON SMEAR  . RBC 04/07/2015 3.83* 3.87 - 5.11 MIL/uL Final  . Hemoglobin 04/07/2015 11.9* 12.0 - 15.0 g/dL Final  . HCT 04/07/2015 37.1  36.0 - 46.0 % Final  . MCV 04/07/2015 96.9  78.0 - 100.0 fL Final  . MCH 04/07/2015 31.1  26.0 - 34.0 pg Final  . MCHC 04/07/2015 32.1  30.0 - 36.0 g/dL Final  . RDW 04/07/2015 14.1  11.5 - 15.5 % Final  . Platelets 04/07/2015 244  150 - 400 K/uL Final   Comment: SPECIMEN CHECKED FOR CLOTS REPEATED TO VERIFY PLATELET COUNT CONFIRMED BY SMEAR   . Neutrophils Relative % 04/07/2015 66   Final  . Lymphocytes Relative 04/07/2015 26   Final  . Monocytes Relative 04/07/2015 7   Final  . Eosinophils Relative 04/07/2015 1   Final  . Basophils Relative 04/07/2015 0   Final  . Neutro Abs 04/07/2015 7.7  1.7 - 7.7 K/uL Final  . Lymphs Abs 04/07/2015 3.0  0.7 - 4.0 K/uL Final  . Monocytes Absolute 04/07/2015 0.8  0.1 - 1.0 K/uL Final  . Eosinophils Absolute 04/07/2015 0.1  0.0 - 0.7 K/uL Final  . Basophils Absolute 04/07/2015 0.0  0.0 - 0.1 K/uL Final  . Smear Review 04/07/2015 MORPHOLOGY UNREMARKABLE   Final  . Sodium 04/07/2015 137  135 - 145 mmol/L Final  . Potassium 04/07/2015 5.1  3.5 - 5.1 mmol/L Final  . Chloride 04/07/2015 104  101 - 111 mmol/L Final  . CO2 04/07/2015 22  22 - 32 mmol/L Final  . Glucose, Bld 04/07/2015 88  65 - 99 mg/dL Final  . BUN 04/07/2015 29* 6 - 20 mg/dL Final  . Creatinine, Ser 04/07/2015 1.25* 0.44 - 1.00 mg/dL Final  . Calcium 04/07/2015 9.0  8.9 - 10.3 mg/dL Final  . Total  Protein 04/07/2015 7.3  6.5 - 8.1 g/dL Final  . Albumin 04/07/2015 3.8  3.5 - 5.0 g/dL Final  . AST 04/07/2015 30  15 - 41 U/L Final  . ALT 04/07/2015 26  14 - 54 U/L Final  . Alkaline Phosphatase 04/07/2015 77  38 - 126 U/L Final  . Total Bilirubin 04/07/2015 0.4  0.3 - 1.2 mg/dL Final  . GFR calc non Af Amer 04/07/2015 42* >60 mL/min Final  . GFR calc Af  Amer 04/07/2015 48* >60 mL/min Final   Comment: (NOTE) The eGFR has been calculated using the CKD EPI equation. This calculation has not been validated in all clinical situations. eGFR's persistently <60 mL/min signify possible Chronic Kidney Disease.   . Anion gap 04/07/2015 11  5 - 15 Final  . Prothrombin Time 04/07/2015 14.0  11.6 - 15.2 seconds Final  . INR 04/07/2015 1.11  0.00 - 1.49 Final  . ABO/RH(D) 04/07/2015 O POS   Final  . Antibody Screen 04/07/2015 NEG   Final  . Sample Expiration 04/07/2015 04/14/2015   Final  . Extend sample reason 04/07/2015 NO TRANSFUSIONS OR PREGNANCY IN THE PAST 3 MONTHS   Final  . Color, Urine 04/07/2015 YELLOW  YELLOW Final  . APPearance 04/07/2015 CLOUDY* CLEAR Final  . Specific Gravity, Urine 04/07/2015 1.027  1.005 - 1.030 Final  . pH 04/07/2015 5.5  5.0 - 8.0 Final  . Glucose, UA 04/07/2015 NEGATIVE  NEGATIVE mg/dL Final  . Hgb urine dipstick 04/07/2015 NEGATIVE  NEGATIVE Final  . Bilirubin Urine 04/07/2015 NEGATIVE  NEGATIVE Final  . Ketones, ur 04/07/2015 NEGATIVE  NEGATIVE mg/dL Final  . Protein, ur 04/07/2015 NEGATIVE  NEGATIVE mg/dL Final  . Nitrite 04/07/2015 NEGATIVE  NEGATIVE Final  . Leukocytes, UA 04/07/2015 SMALL* NEGATIVE Final  . MRSA, PCR 04/07/2015 NEGATIVE  NEGATIVE Final  . Staphylococcus aureus 04/07/2015 NEGATIVE  NEGATIVE Final   Comment:        The Xpert SA Assay (FDA approved for NASAL specimens in patients over 8 years of age), is one component of a comprehensive surveillance program.  Test performance has been validated by [facility] for patients  greater than or equal to 43 year old. It is not intended to diagnose infection nor to guide or monitor treatment.   . Squamous Epithelial / LPF 04/07/2015 0-5* NONE SEEN Final  . WBC, UA 04/07/2015 6-30  0 - 5 WBC/hpf Final  . RBC / HPF 04/07/2015 0-5  0 - 5 RBC/hpf Final  . Bacteria, UA 04/07/2015 FEW* NONE SEEN Final  . Casts 04/07/2015 HYALINE CASTS* NEGATIVE Final  . ABO/RH(D) 04/07/2015 O POS   Final     X-Rays:Dg Chest 2 View  04/07/2015  CLINICAL DATA:  Preop for total knee replacement. EXAM: CHEST  2 VIEW COMPARISON:  None. FINDINGS: The heart size and mediastinal contours are within normal limits. Both lungs are clear. The visualized skeletal structures are unremarkable. IMPRESSION: No active cardiopulmonary disease. Electronically Signed   By: Marijo Conception, M.D.   On: 04/07/2015 14:42      Hospital Course: Rebekah Skinner is a 75 y.o. who was admitted to [facility]. They were brought to the operating room on 04/11/2015 and underwent Procedure(s): TOTAL RIGHT KNEE ARTHROPLASTY.  Patient tolerated the procedure well and was later transferred to the recovery room and then to the orthopaedic floor for postoperative care.  They were given PO and IV analgesics for pain control following their surgery.  They were given 24 hours of postoperative antibiotics of  Anti-infectives    Start     Dose/Rate Route Frequency Ordered Stop   04/11/15 1800  ceFAZolin (ANCEF) IVPB 1 g/50 mL premix     1 g 100 mL/hr over 30 Minutes Intravenous Every 6 hours 04/11/15 1741 04/11/15 2336   04/11/15 1100  ceFAZolin (ANCEF) IVPB 2 g/50 mL premix     2 g 100 mL/hr over 30 Minutes Intravenous On call to O.R. 04/11/15 1041 04/11/15 1254  04/11/15 0000  polymyxin B 500,000 Units, bacitracin 50,000 Units in sodium chloride irrigation 0.9 % 500 mL irrigation  Status:  Discontinued       As needed 04/11/15 1325 04/11/15 1455     and started on DVT prophylaxis in the form of Xarelto.   PT and OT were  ordered for total joint protocol.  Discharge planning consulted to help with postop disposition and equipment needs.  Patient had a good night on the evening of surgery.  They started to get up OOB with therapy on day one. Hemovac drain was pulled without difficulty.  Continued to work with therapy into day two.  Dressing was changed on day two and the incision was clean and dry.  The patient had progressed with therapy and meeting their goals.  Incision was healing well.  Patient was seen in rounds and was ready to go home. Patient was transitioned from Xarelto to aspirin upon discharge home.    Diet: Cardiac diet Activity:WBAT Follow-up:in 2 weeks Disposition - Home Discharged Condition: stable   Discharge Instructions    Call MD / Call 911    Complete by:  As directed   If you experience chest pain or shortness of breath, CALL 911 and be transported to the hospital emergency room.  If you develope a fever above 101 F, pus (white drainage) or increased drainage or redness at the wound, or calf pain, call your surgeon's office.     Constipation Prevention    Complete by:  As directed   Drink plenty of fluids.  Prune juice may be helpful.  You may use a stool softener, such as Colace (over the counter) 100 mg twice a day.  Use MiraLax (over the counter) for constipation as needed.     Diet - low sodium heart healthy    Complete by:  As directed      Discharge instructions    Complete by:  As directed   INSTRUCTIONS AFTER JOINT REPLACEMENT   Remove items at home which could result in a fall. This includes throw rugs or furniture in walking pathways ICE to the affected joint every three hours while awake for 30 minutes at a time, for at least the first 3-5 days, and then as needed for pain and swelling.  Continue to use ice for pain and swelling. You may notice swelling that will progress down to the foot and ankle.  This is normal after surgery.  Elevate your leg when you are not up walking on  it.   Continue to use the breathing machine you got in the hospital (incentive spirometer) which will help keep your temperature down.  It is common for your temperature to cycle up and down following surgery, especially at night when you are not up moving around and exerting yourself.  The breathing machine keeps your lungs expanded and your temperature down.   DIET:  As you were doing prior to hospitalization, we recommend a well-balanced diet.  DRESSING / WOUND CARE / SHOWERING  You may change your dressing daily after surgery  with sterile gauze.  Please use good hand washing techniques before changing the dressing.  Do not use any lotions or creams on the incision until instructed by your surgeon.  ACTIVITY  Increase activity slowly as tolerated, but follow the weight bearing instructions below.   No driving for 6 weeks or until further direction given by your physician.  You cannot drive while taking narcotics.  No lifting or carrying greater  than 10 lbs. until further directed by your surgeon. Avoid periods of inactivity such as sitting longer than an hour when not asleep. This helps prevent blood clots.  You may return to work once you are authorized by your doctor.     WEIGHT BEARING   Weight bearing as tolerated with assist device (walker, cane, etc) as directed, use it as long as suggested by your surgeon or therapist, typically at least 4-6 weeks.   EXERCISES  Results after joint replacement surgery are often greatly improved when you follow the exercise, range of motion and muscle strengthening exercises prescribed by your doctor. Safety measures are also important to protect the joint from further injury. Any time any of these exercises cause you to have increased pain or swelling, decrease what you are doing until you are comfortable again and then slowly increase them. If you have problems or questions, call your caregiver or physical therapist for advice.    Rehabilitation is important following a joint replacement. After just a few days of immobilization, the muscles of the leg can become weakened and shrink (atrophy).  These exercises are designed to build up the tone and strength of the thigh and leg muscles and to improve motion. Often times heat used for twenty to thirty minutes before working out will loosen up your tissues and help with improving the range of motion but do not use heat for the first two weeks following surgery (sometimes heat can increase post-operative swelling).   These exercises can be done on a training (exercise) mat, on the floor, on a table or on a bed. Use whatever works the best and is most comfortable for you.    Use music or television while you are exercising so that the exercises are a pleasant break in your day. This will make your life better with the exercises acting as a break in your routine that you can look forward to.   Perform all exercises about fifteen times, three times per day or as directed.  You should exercise both the operative leg and the other leg as well.  Exercises include:   Quad Sets - Tighten up the muscle on the front of the thigh (Quad) and hold for 5-10 seconds.   Straight Leg Raises - With your knee straight (if you were given a brace, keep it on), lift the leg to 60 degrees, hold for 3 seconds, and slowly lower the leg.  Perform this exercise against resistance later as your leg gets stronger.  Leg Slides: Lying on your back, slowly slide your foot toward your buttocks, bending your knee up off the floor (only go as far as is comfortable). Then slowly slide your foot back down until your leg is flat on the floor again.  Angel Wings: Lying on your back spread your legs to the side as far apart as you can without causing discomfort.  Hamstring Strength:  Lying on your back, push your heel against the floor with your leg straight by tightening up the muscles of your buttocks.  Repeat, but this  time bend your knee to a comfortable angle, and push your heel against the floor.  You may put a pillow under the heel to make it more comfortable if necessary.   A rehabilitation program following joint replacement surgery can speed recovery and prevent re-injury in the future due to weakened muscles. Contact your doctor or a physical therapist for more information on knee rehabilitation.    CONSTIPATION  Constipation is  defined medically as fewer than three stools per week and severe constipation as less than one stool per week.  Even if you have a regular bowel pattern at home, your normal regimen is likely to be disrupted due to multiple reasons following surgery.  Combination of anesthesia, postoperative narcotics, change in appetite and fluid intake all can affect your bowels.   YOU MUST use at least one of the following options; they are listed in order of increasing strength to get the job done.  They are all available over the counter, and you may need to use some, POSSIBLY even all of these options:    Drink plenty of fluids (prune juice may be helpful) and high fiber foods Colace 100 mg by mouth twice a day  Senokot for constipation as directed and as needed Dulcolax (bisacodyl), take with full glass of water  Miralax (polyethylene glycol) once or twice a day as needed.  If you have tried all these things and are unable to have a bowel movement in the first 3-4 days after surgery call either your surgeon or your primary doctor.    If you experience loose stools or diarrhea, hold the medications until you stool forms back up.  If your symptoms do not get better within 1 week or if they get worse, check with your doctor.  If you experience "the worst abdominal pain ever" or develop nausea or vomiting, please contact the office immediately for further recommendations for treatment.   ITCHING:  If you experience itching with your medications, try taking only a single pain pill, or even  half a pain pill at a time.  You can also use Benadryl over the counter for itching or also to help with sleep.   TED HOSE STOCKINGS:  Use stockings on both legs until for at least 2 weeks or as directed by physician office. They may be removed at night for sleeping.  MEDICATIONS:  See your medication summary on the "After Visit Summary" that nursing will review with you.  You may have some home medications which will be placed on hold until you complete the course of blood thinner medication.  It is important for you to complete the blood thinner medication as prescribed.  PRECAUTIONS:  If you experience chest pain or shortness of breath - call 911 immediately for transfer to the hospital emergency department.   If you develop a fever greater that 101 F, purulent drainage from wound, increased redness or drainage from wound, foul odor from the wound/dressing, or calf pain - CONTACT YOUR SURGEON.                                                   FOLLOW-UP APPOINTMENTS:  If you do not already have a post-op appointment, please call the office for an appointment to be seen by your surgeon.  Guidelines for how soon to be seen are listed in your "After Visit Summary", but are typically between 1-4 weeks after surgery.  OTHER INSTRUCTIONS:  Take aspirin 353m twice daily to prevent blood clots  MAKE SURE YOU:  Understand these instructions.  Get help right away if you are not doing well or get worse.    Thank you for letting [facility] a part of your medical care team.  It is a privilege we respect greatly.  We hope these  instructions will help you stay on track for a fast and full recovery!     Increase activity slowly as tolerated    Complete by:  As directed             Medication List    STOP taking these medications        aspirin EC 81 MG tablet  Replaced by:  aspirin 325 MG tablet      TAKE these medications        aspirin 325 MG tablet  Take 1 tablet (325 mg total) by mouth 2  (two) times daily. To prevent blood clots     cholecalciferol 1000 units tablet  Commonly known as:  VITAMIN D  Take 1,000 Units by mouth daily.     losartan 100 MG tablet  Commonly known as:  COZAAR  Take 100 mg by mouth at bedtime.     meloxicam 15 MG tablet  Commonly known as:  MOBIC  Take 15 mg by mouth daily.     methocarbamol 500 MG tablet  Commonly known as:  ROBAXIN  Take 1 tablet (500 mg total) by mouth every 6 (six) hours as needed for muscle spasms.     oxyCODONE-acetaminophen 5-325 MG tablet  Commonly known as:  PERCOCET/ROXICET  Take 1-2 tablets by mouth every 4 (four) hours as needed for severe pain (Q4-6 hours PRN).     vitamin B-12 1000 MCG tablet  Commonly known as:  CYANOCOBALAMIN  Take 1,000 mcg by mouth daily.           Follow-up Information    Follow up with GIOFFRE,RONALD A, MD. Schedule an appointment as soon as possible for a visit in 2 weeks.   Specialty:  Orthopedic Surgery   Contact information:   [address] [place] [address] [phone]       Follow up with [facility].   Why:  physical therapy   Contact information:   [address] [place] [address] [phone]       Follow up with [facility].   Why:  rolling walker   Contact information:   [place] [address] [phone]       Signed: Ardeen Jourdain, PA-C Orthopaedic Surgery 04/14/2015, 8:30 AM

## 2015-07-24 ENCOUNTER — Telehealth: Payer: Self-pay | Admitting: Cardiology

## 2015-07-26 ENCOUNTER — Telehealth: Payer: Self-pay | Admitting: Cardiology

## 2015-07-26 NOTE — Telephone Encounter (Signed)
Faxed signed Release to Dr Daryel NovemberGary Miller [facility] [place] -Phone [phone] ---Fax [phone] to obtain patient cardiology records for upcoming appointment with Dr Jens Somrenshaw.  Patient wants to establish with Dr Jens Somrenshaw.

## 2015-07-26 NOTE — Telephone Encounter (Signed)
Close encounter 

## 2015-10-17 ENCOUNTER — Telehealth: Payer: Self-pay | Admitting: Cardiology

## 2015-10-17 NOTE — Telephone Encounter (Signed)
[facility] Dr Jens Somrenshaw on 11/23/15.  [facility] for Dr Ludwig Clarksrenshaw's schedule on 11/23/15. lp

## 2015-11-07 ENCOUNTER — Encounter: Payer: Self-pay | Admitting: Cardiology

## 2015-11-21 NOTE — Progress Notes (Signed)
HPI: 75 yo female for evaluation of CAD. Previously followed in [place]. 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 Catheterization revealed ejection fraction 40-45% with inferior akinesis. There was a 50% LAD. There was a 99% focal RCA at right ventricular branch. There was a 50% PDA. Patient had PCI of RCA.She has a history of peripheral vascular disease and ruptured abdominal aortic aneurysm. Echocardiogram September 2016 showed normal LV function and no significant valvular disease by report. Nuclear study September 2016 showed ejection fraction 41% with mild inferior hypokinesis. No ischemia. Patient denies Chest pain, palpitations or syncope. She has dyspnea with more vigorous activities but not routine activities. No orthopnea or PND.  Current Outpatient Prescriptions  Medication Sig Dispense Refill  . aspirin 81 MG tablet Take 81 mg by mouth daily.    . cholecalciferol (VITAMIN D) 1000 units tablet Take 1,000 Units by mouth daily.    Marland Kitchen losartan (COZAAR) 100 MG tablet Take 100 mg by mouth at bedtime.    . meloxicam (MOBIC) 15 MG tablet Take 15 mg by mouth daily.    . predniSONE (DELTASONE) 10 MG tablet Take 10 mg by mouth as directed.    . vitamin B-12 (CYANOCOBALAMIN) 1000 MCG tablet Take 1,000 mcg by mouth daily.     No current facility-administered medications for this visit.     Allergies  Allergen Reactions  . Bactrim Ds [Sulfamethoxazole-Trimethoprim] Nausea And Vomiting  . Darvon [Propoxyphene] Nausea And Vomiting  . Docusate Nausea And Vomiting  . Other     Darvocet=nausea vomiting  . Zyvox [Linezolid] Nausea And Vomiting     Past Medical History:  Diagnosis Date  . CAD (coronary artery disease)   . Hyperlipidemia   . Hypertension   . Myocardial infarction [facility]) 2002    Past Surgical History:  Procedure Laterality Date  . ABDOMINAL HYSTERECTOMY  1 ovary removed  . APPENDECTOMY  8th grade  . CHOLECYSTECTOMY  april 2013  . EYE SURGERY Left  05-25-2013   cataract lens replacement  . other surgery  2007   12 other surgeries due to ruptued aneurysm  . right knee meniscus repair  sept 30, 2016  . stent to heart      right rca 7024-2002  . surgery for ruptured abdominal aortic aneurysm  2007  . TONSILLECTOMY  7th grade  . TOTAL KNEE ARTHROPLASTY Right 04/11/2015   Procedure: TOTAL RIGHT KNEE ARTHROPLASTY;  Surgeon: Ranee Gosselin, MD;  Location: [facility];  Service: Orthopedics;  Laterality: Right;  . TUBAL LIGATION  1971    Social History   Social History  . Marital status: Widowed    Spouse name: N/A  . Number of children: 2  . Years of education: N/A   Occupational History  . Not on file.   Social History Main Topics  . Smoking status: Former Smoker    Types: Cigarettes  . Smokeless tobacco: Never Used     Comment: social smoker quit years ago  . Alcohol use No  . Drug use: No  . Sexual activity: Not on file   Other Topics Concern  . Not on file   Social History Narrative  . No narrative on file    Family History  Problem Relation Age of Onset  . CAD Mother   . CAD Brother     ROS: no fevers or chills, productive cough, hemoptysis, dysphasia, odynophagia, melena, hematochezia, dysuria, hematuria, rash, seizure activity, orthopnea, PND, pedal edema, claudication. Remaining systems are negative.  Physical Exam:   Blood pressure (!) 162/90, pulse 67, height 5\' 5"  (1.651 m), weight 215 lb 9.6 oz (97.8 kg).  General:  Well developed/well nourished in NAD Skin warm/dry Patient not depressed No peripheral clubbing Back-normal HEENT-normal/normal eyelids Neck supple/normal carotid upstroke bilaterally; no bruits; no JVD; no thyromegaly chest - CTA/ normal expansion CV - RRR/normal S1 and S2; no murmurs, rubs or gallops;  PMI nondisplaced Abdomen -NT/ND, no HSM, no mass, + bowel sounds, no bruit 2+ femoral pulses, no bruits Ext-no edema, chords, 2+ DP Neuro-grossly nonfocal  ECG - Sinus rhythm at a  rate of 67. No ST changes.  A/P  1 Coronary artery disease-continue aspirin. She is intolerant to statins secondary to myalgias.  2 hyperlipidemia-continue diet. Intolerant to statins. Not willing to try additional agents.  3 hypertension-blood pressure is mildly elevated today but she states typically controlled. She will follow this at home and we will adjust regimen as needed.  Olga MillersBrian Crenshaw, MD

## 2015-11-23 ENCOUNTER — Encounter: Payer: Self-pay | Admitting: Cardiology

## 2015-11-23 ENCOUNTER — Ambulatory Visit (INDEPENDENT_AMBULATORY_CARE_PROVIDER_SITE_OTHER): Payer: Medicare Other | Admitting: Cardiology

## 2015-11-23 VITALS — BP 162/90 | HR 67 | Ht 65.0 in | Wt 215.6 lb

## 2015-11-23 DIAGNOSIS — I10 Essential (primary) hypertension: Secondary | ICD-10-CM

## 2015-11-23 DIAGNOSIS — I2510 Atherosclerotic heart disease of native coronary artery without angina pectoris: Secondary | ICD-10-CM | POA: Diagnosis not present

## 2015-11-23 DIAGNOSIS — E785 Hyperlipidemia, unspecified: Secondary | ICD-10-CM

## 2015-11-23 NOTE — Patient Instructions (Signed)
Your physician wants you to follow-up in: ONE YEAR WITH DR CRENSHAW You will receive a reminder letter in the mail two months in advance. If you don't receive a letter, please call our office to schedule the follow-up appointment.   If you need a refill on your cardiac medications before your next appointment, please call your pharmacy.  

## 2016-12-30 NOTE — Progress Notes (Signed)
HPI: FU CAD. Previously followed in [place]. 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 Catheterization revealed ejection fraction 40-45% with inferior akinesis. There was a 50% LAD. There was a 99% focal RCA at right ventricular branch. There was a 50% PDA. Patient had PCI of RCA.She has a history of peripheral vascular disease and ruptured abdominal aortic aneurysm. Echocardiogram September 2016 showed normal LV function and no significant valvular disease by report. Nuclear study September 2016 showed ejection fraction 41% with mild inferior hypokinesis. No ischemia. Since last seen, she notes some dyspnea on exertion but no orthopnea, PND, pedal edema, chest pain or syncope.  Current Outpatient Prescriptions  Medication Sig Dispense Refill  . aspirin 81 MG tablet Take 81 mg by mouth daily.    . cholecalciferol (VITAMIN D) 1000 units tablet Take 1,000 Units by mouth daily.    . hydrochlorothiazide (HYDRODIURIL) 25 MG tablet Take 12.5 mg by mouth daily.  0  . metoprolol succinate (TOPROL-XL) 50 MG 24 hr tablet Take 50 mg by mouth daily.  1  . vitamin B-12 (CYANOCOBALAMIN) 1000 MCG tablet Take 1,000 mcg by mouth daily.    . vitamin C (ASCORBIC ACID) 500 MG tablet Take 500 mg by mouth daily.     No current facility-administered medications for this visit.      Past Medical History:  Diagnosis Date  . CAD (coronary artery disease)   . Hyperlipidemia   . Hypertension   . Myocardial infarction [facility]) 2002    Past Surgical History:  Procedure Laterality Date  . ABDOMINAL HYSTERECTOMY  1 ovary removed  . APPENDECTOMY  8th grade  . CHOLECYSTECTOMY  april 2013  . EYE SURGERY Left 05-25-2013   cataract lens replacement  . other surgery  2007   12 other surgeries due to ruptued aneurysm  . right knee meniscus repair  sept 30, 2016  . stent to heart      right rca 7024-2002  . surgery for ruptured abdominal aortic aneurysm  2007  . TONSILLECTOMY  7th grade  . TOTAL  KNEE ARTHROPLASTY Right 04/11/2015   Procedure: TOTAL RIGHT KNEE ARTHROPLASTY;  Surgeon: Ranee Gosselin, MD;  Location: [facility];  Service: Orthopedics;  Laterality: Right;  . TUBAL LIGATION  1971    Social History   Social History  . Marital status: Widowed    Spouse name: N/A  . Number of children: 2  . Years of education: N/A   Occupational History  . Not on file.   Social History Main Topics  . Smoking status: Former Smoker    Types: Cigarettes  . Smokeless tobacco: Never Used     Comment: social smoker quit years ago  . Alcohol use No  . Drug use: No  . Sexual activity: Not on file   Other Topics Concern  . Not on file   Social History Narrative  . No narrative on file    Family History  Problem Relation Age of Onset  . CAD Mother   . CAD Brother     ROS: no fevers or chills, productive cough, hemoptysis, dysphasia, odynophagia, melena, hematochezia, dysuria, hematuria, rash, seizure activity, orthopnea, PND, pedal edema, claudication. Remaining systems are negative.  Physical Exam: Well-developed obese in no acute distress.  Skin is warm and dry.  HEENT is normal.  Neck is supple.  Chest is clear to auscultation with normal expansion.  Cardiovascular exam is regular rate and rhythm.  Abdominal exam nontender or distended. No masses palpated.  Extremities show no edema. neuro grossly intact  ECG- Sinus rhythm at a rate of 70, no ST changes. personally reviewed  A/P  1 Coronary artery disease-plan to continue aspirin. Patient is intolerant to statins.  2 hypertension-blood pressure is controlled. However I am adding ARB for her ischemic cardiomyopathy. Therefore I will discontinue amlodipine.  3 ischemic cardiomyopathy-continue Toprol at present dose. I have reviewed her most recent laboratories from primary care. Her BUN is 26 with creatinine 1.47. I think she would benefit from an ARB long-term. Discontinue amlodipine. Add Cozaar 50 mg daily. Check  potassium and renal function in one week.  4 hyperlipidemia-patient is intolerant to statins. Continue diet. She is not interested in considering other medications for her cholesterol. I have reviewed her most recent laboratories and her total cholesterol was 133 with LDL 64.   5 History of repaired ruptured abdominal aortic aneurysm-we'll repeat ultrasound.   Olga MillersBrian Crenshaw, MD

## 2017-01-07 ENCOUNTER — Ambulatory Visit (INDEPENDENT_AMBULATORY_CARE_PROVIDER_SITE_OTHER): Payer: Medicare Other | Admitting: Cardiology

## 2017-01-07 ENCOUNTER — Encounter: Payer: Self-pay | Admitting: Cardiology

## 2017-01-07 VITALS — BP 120/80 | HR 70 | Ht 65.0 in | Wt 216.4 lb

## 2017-01-07 DIAGNOSIS — I10 Essential (primary) hypertension: Secondary | ICD-10-CM | POA: Diagnosis not present

## 2017-01-07 DIAGNOSIS — I713 Abdominal aortic aneurysm, ruptured, unspecified: Secondary | ICD-10-CM

## 2017-01-07 DIAGNOSIS — E7800 Pure hypercholesterolemia, unspecified: Secondary | ICD-10-CM | POA: Diagnosis not present

## 2017-01-07 DIAGNOSIS — I255 Ischemic cardiomyopathy: Secondary | ICD-10-CM

## 2017-01-07 DIAGNOSIS — I2510 Atherosclerotic heart disease of native coronary artery without angina pectoris: Secondary | ICD-10-CM | POA: Diagnosis not present

## 2017-01-07 MED ORDER — LOSARTAN POTASSIUM 50 MG PO TABS: 50.0000 mg | ORAL_TABLET | Freq: Every day | ORAL | 3 refills | Status: DC

## 2017-01-07 NOTE — Patient Instructions (Signed)
Medication Instructions:   STOP AMLODIPINE  START LOSARTAN 50 MG ONCE DAILY  Labwork:  Your physician recommends that you return for lab work in: ONE WEEK  Testing/Procedures:  Your physician has requested that you have an abdominal aorta duplex. During this test, an ultrasound is used to evaluate the aorta. Allow 30 minutes for this exam. Do not eat after midnight the day before and avoid carbonated beverages   Follow-Up:  Your physician wants you to follow-up in: ONE YEAR WITH DR Shelda PalRENSHAW You will receive a reminder letter in the mail two months in advance. If you don't receive a letter, please call our office to schedule the follow-up appointment.   If you need a refill on your cardiac medications before your next appointment, please call your pharmacy.

## 2017-01-08 ENCOUNTER — Telehealth: Payer: Self-pay | Admitting: Cardiology

## 2017-01-08 DIAGNOSIS — I713 Abdominal aortic aneurysm, ruptured, unspecified: Secondary | ICD-10-CM

## 2017-01-08 NOTE — Telephone Encounter (Signed)
Procedure code [id number], [facility]

## 2017-01-08 NOTE — Telephone Encounter (Signed)
New message     [facility] calling to obtain procedure code  for upcoming procedure 11/15.  Patient wants [employer] to confirm her copay. The only way they can do that is with procedure code. Please call [phone] Use ref # [id number] when calling [employer].

## 2017-01-09 ENCOUNTER — Telehealth: Payer: Self-pay | Admitting: Cardiology

## 2017-01-09 MED ORDER — AMLODIPINE BESYLATE 5 MG PO TABS: 5.0000 mg | ORAL_TABLET | Freq: Every day | ORAL | 3 refills | Status: AC

## 2017-01-09 NOTE — Telephone Encounter (Signed)
New Message  Pt call requesting to speak with Stanton KidneyDebra. Pt states she would like to talk about recent appt. Please call back to discuss

## 2017-01-09 NOTE — Telephone Encounter (Signed)
Spoke with pt, records from pcp dr vasireddy's office regarding previous losartan use and problems reviewed by dr Jens Somcrenshaw. Patient instructed not to start the losartan, she will continue the amlodipine 5 mg as previous.

## 2017-01-17 ENCOUNTER — Inpatient Hospital Stay (HOSPITAL_COMMUNITY): Admission: RE | Admit: 2017-01-17 | Payer: Medicare Other | Source: Ambulatory Visit

## 2017-01-23 ENCOUNTER — Other Ambulatory Visit (HOSPITAL_COMMUNITY): Payer: Medicare Other

## 2017-02-28 ENCOUNTER — Ambulatory Visit (HOSPITAL_COMMUNITY)
Admission: RE | Admit: 2017-02-28 | Discharge: 2017-02-28 | Disposition: A | Discharge status: Home / Self Care | Payer: Medicare Other | Source: Ambulatory Visit | Attending: Cardiovascular Disease | Admitting: Cardiovascular Disease

## 2017-02-28 ENCOUNTER — Encounter (INDEPENDENT_AMBULATORY_CARE_PROVIDER_SITE_OTHER): Payer: Self-pay

## 2017-02-28 DIAGNOSIS — I713 Abdominal aortic aneurysm, ruptured, unspecified: Secondary | ICD-10-CM

## 2017-03-06 ENCOUNTER — Other Ambulatory Visit: Payer: Self-pay | Admitting: *Deleted

## 2017-03-06 DIAGNOSIS — I714 Abdominal aortic aneurysm, without rupture, unspecified: Secondary | ICD-10-CM

## 2017-04-08 ENCOUNTER — Telehealth: Payer: Self-pay | Admitting: Cardiovascular Disease

## 2017-04-08 NOTE — Telephone Encounter (Signed)
Spoke with pt, questions regarding AAA and follow up. Copy mailed to the patient and copy faxed to medical doctor at her request.

## 2017-04-08 NOTE — Telephone Encounter (Signed)
Spoke to patient.patient states would like to speak to debra

## 2017-04-08 NOTE — Telephone Encounter (Signed)
New Message    Patient is requesting a callback about a duplex study that she had. Please call to discuss.

## 2017-04-10 ENCOUNTER — Telehealth: Payer: Self-pay | Admitting: Cardiology

## 2017-04-10 NOTE — Telephone Encounter (Signed)
Spoke with pt, her medical doctor did another US of her abdomen and now she needs to see nephrologist for a shrinking  kidney and she was also told she has a fatty liver.  She wanted to know a kidney doctor in [place]. [facility] [facility] suggested to the patient. She will get her medical doctor to refer her.

## 2017-04-10 NOTE — Telephone Encounter (Signed)
New message ° °Pt verbalized that she is calling for the RN °

## 2017-04-17 IMAGING — DX DG CHEST 2V
2 series · 2 of 2 positions shown · non-contrast
Comparison: None.

CLINICAL DATA: Preop for total knee replacement.

EXAM:
CHEST  2 VIEW

[chest pa]
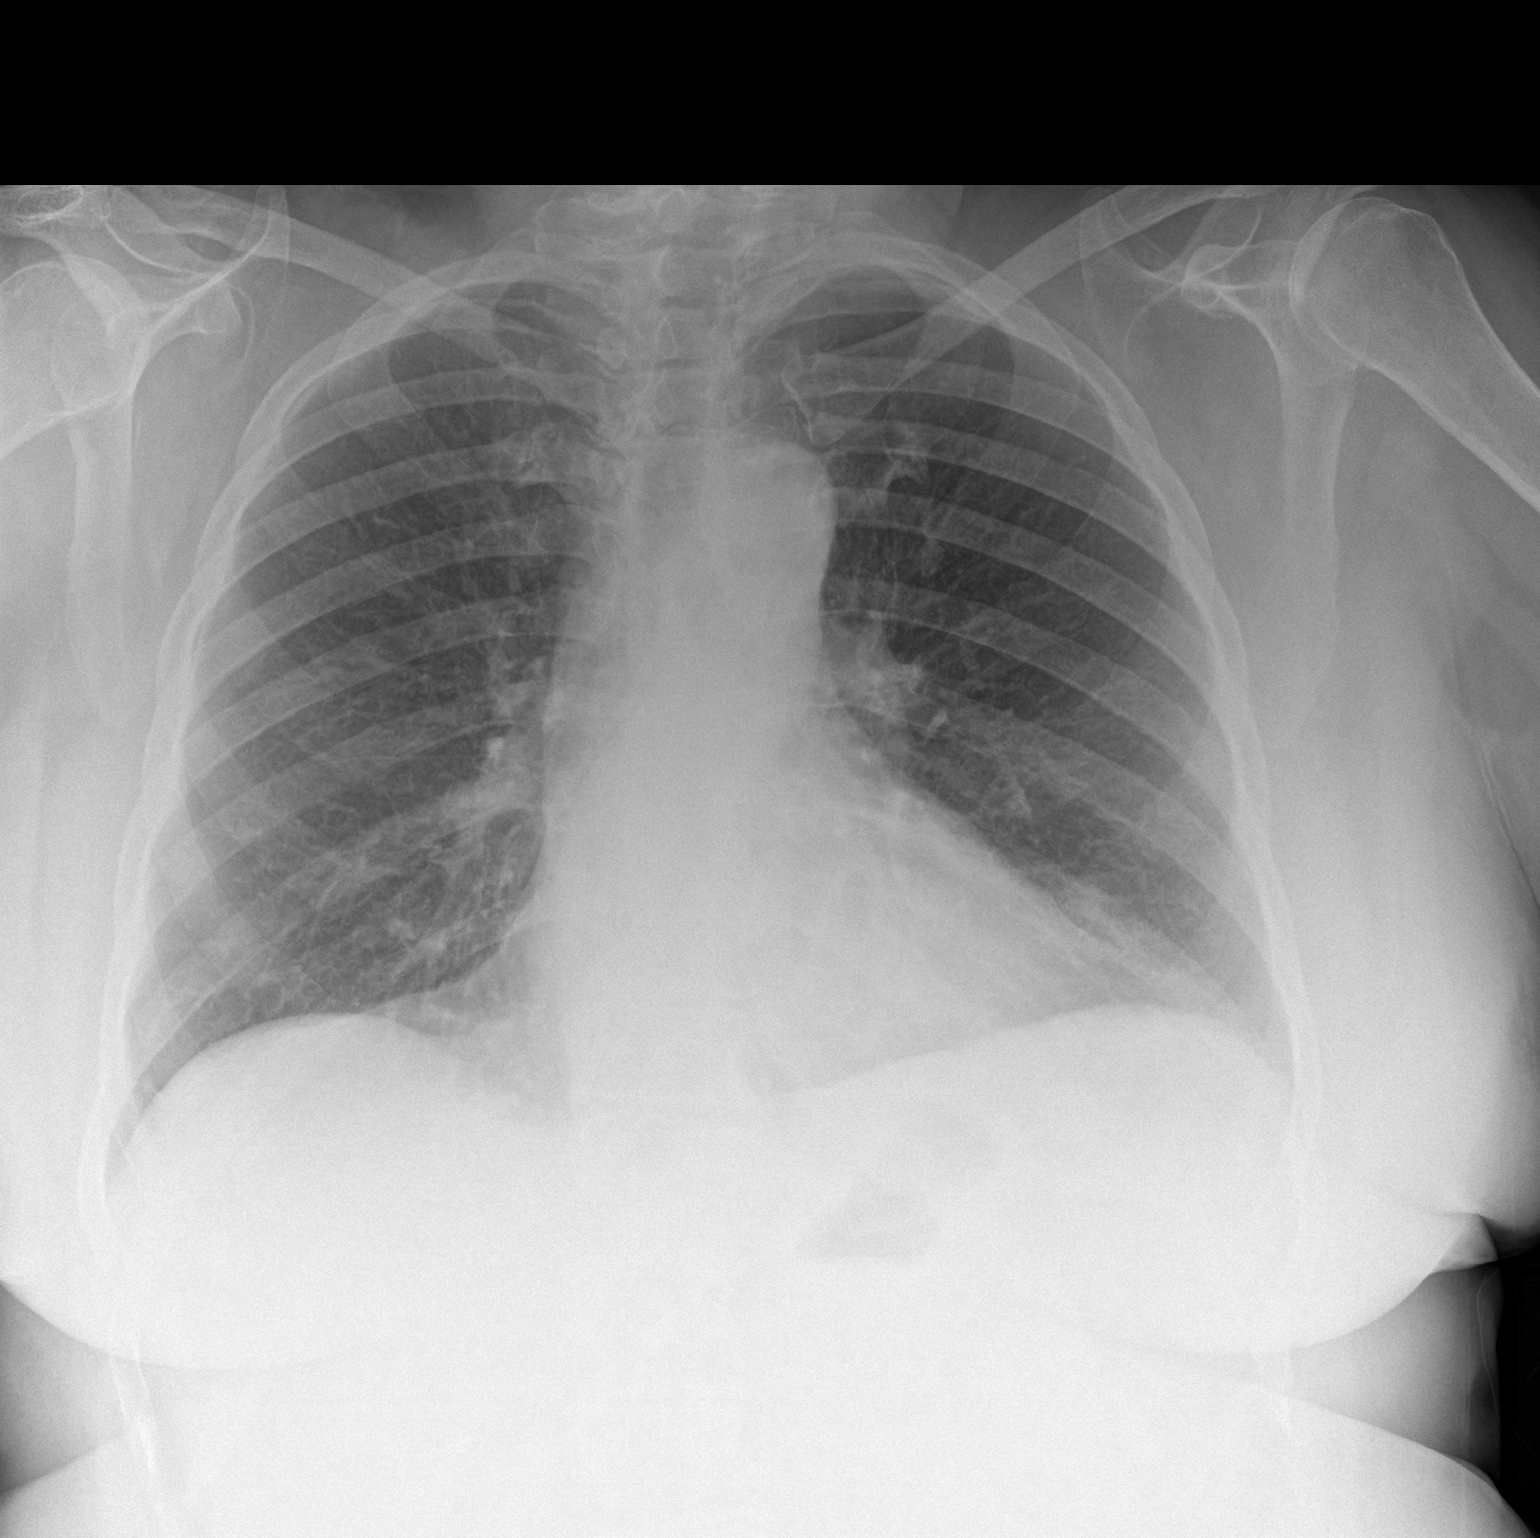

[chest lat]
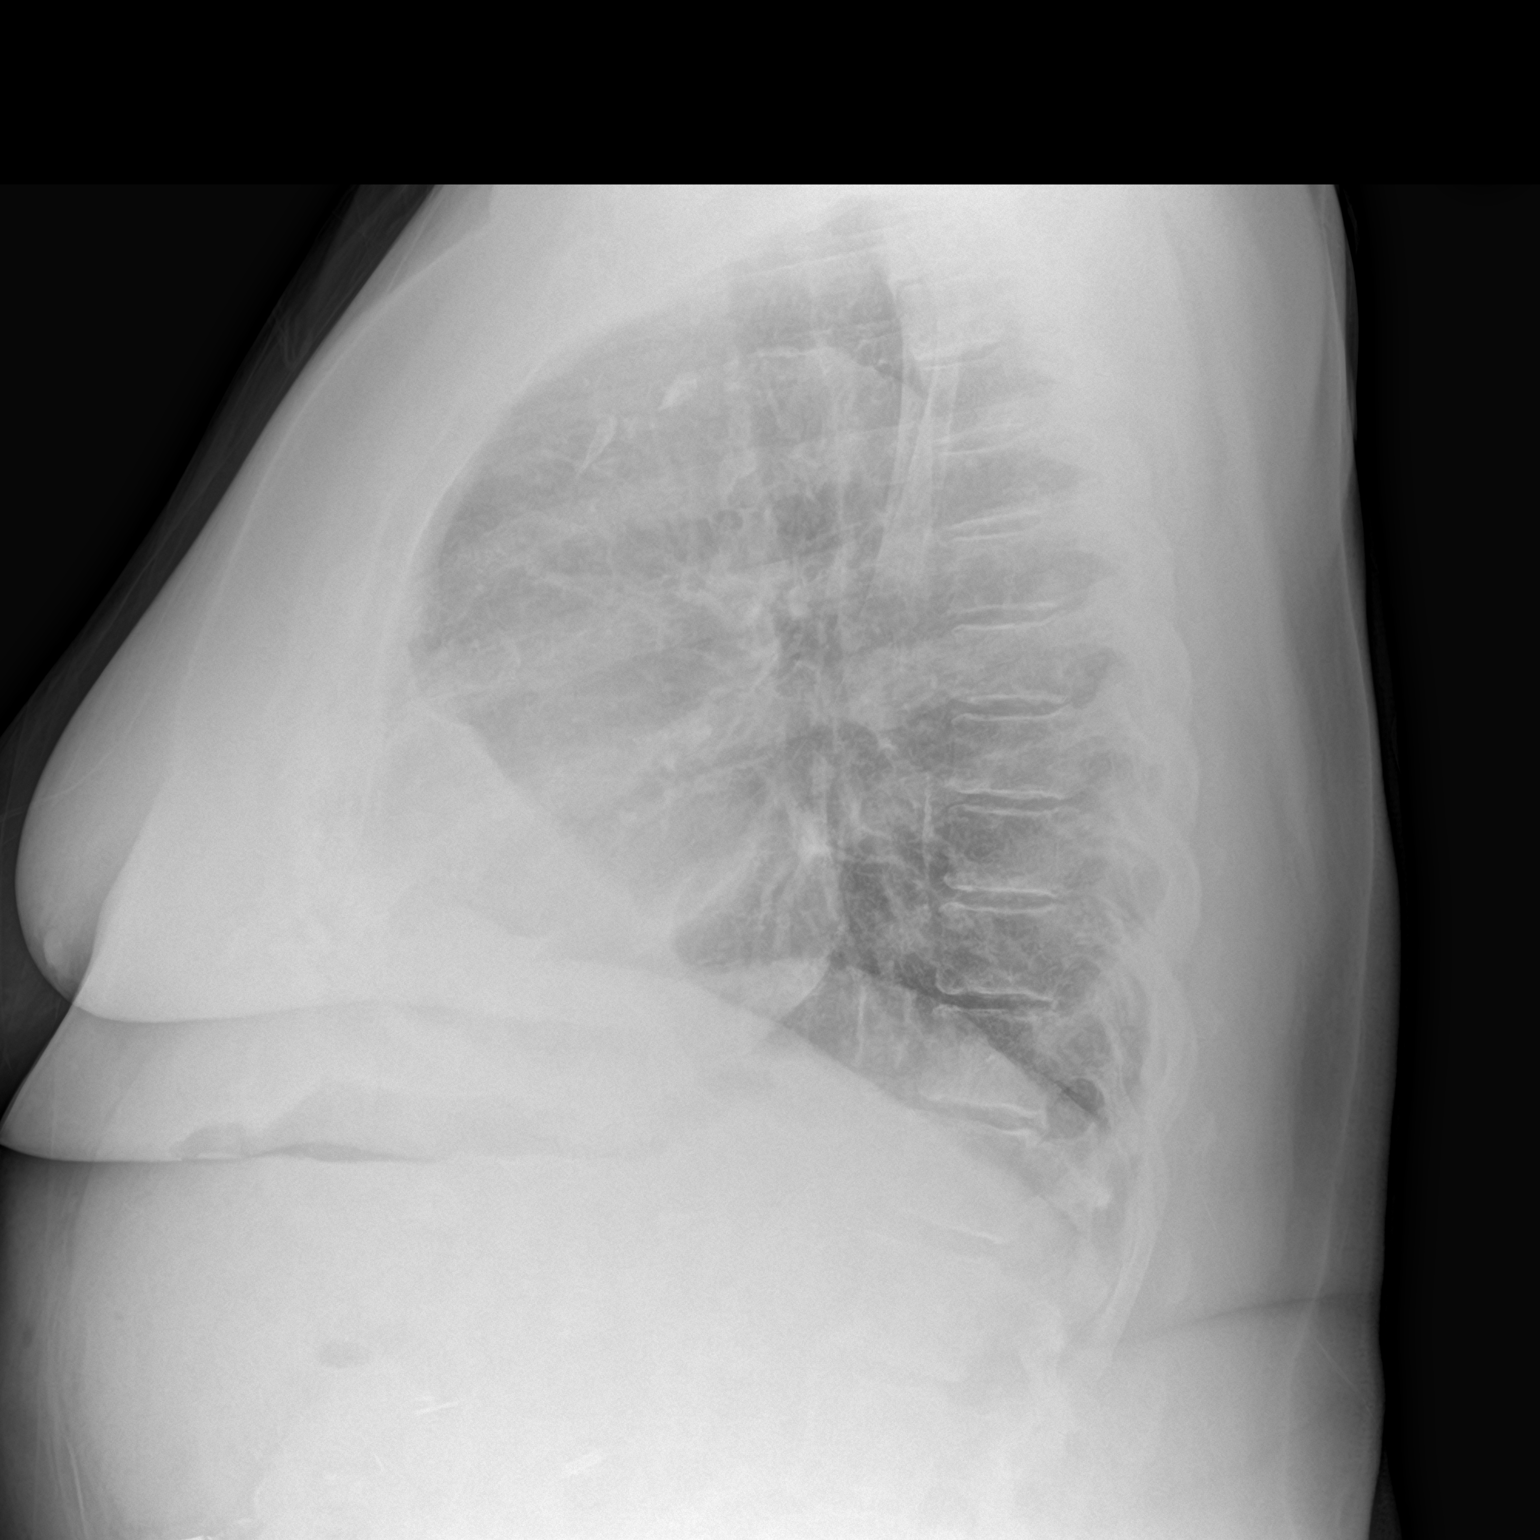

[2 of 2 positions shown; findings below may reference images not displayed]

FINDINGS: The heart size and mediastinal contours are within normal limits.
Both lungs are clear. The visualized skeletal structures are
unremarkable.
IMPRESSION: No active cardiopulmonary disease.

## 2018-01-08 NOTE — Progress Notes (Signed)
HPI: FU CAD. Previously followed in [place]. 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 Catheterization revealed ejection fraction 40-45% with inferior akinesis. There was a 50% LAD. There was a 99% focal RCA at right ventricular branch. There was a 50% PDA. Patient had PCI of RCA.She has a history of peripheral vascular disease and ruptured abdominal aortic aneurysm. Echocardiogram September 2016 showed normal LV function and no significant valvular disease by report. Nuclear study September 2016 showed ejection fraction 41% with mild inferior hypokinesis. No ischemia.   Abdominal ultrasound December 2018 showed 3.1 cm abdominal aortic aneurysm.  Since last seen, the patient has dyspnea with more extreme activities but not with routine activities. It is relieved with rest. It is not associated with chest pain. There is no orthopnea, PND or pedal edema. There is no syncope or palpitations. There is no exertional chest pain.   Current Outpatient Medications  Medication Sig Dispense Refill  . amLODipine (NORVASC) 5 MG tablet Take 1 tablet (5 mg total) by mouth daily. 90 tablet 3  . aspirin 81 MG tablet Take 81 mg by mouth daily.    . cholecalciferol (VITAMIN D) 1000 units tablet Take 1,000 Units by mouth daily.    . hydrochlorothiazide (HYDRODIURIL) 25 MG tablet Take 12.5 mg by mouth daily.  0  . metoprolol succinate (TOPROL-XL) 50 MG 24 hr tablet Take 50 mg by mouth daily.  1  . vitamin B-12 (CYANOCOBALAMIN) 1000 MCG tablet Take 1,000 mcg by mouth daily.    . vitamin C (ASCORBIC ACID) 500 MG tablet Take 500 mg by mouth daily.     No current facility-administered medications for this visit.      Past Medical History:  Diagnosis Date  . CAD (coronary artery disease)   . Hyperlipidemia   . Hypertension   . Myocardial infarction [facility]) 2002    Past Surgical History:  Procedure Laterality Date  . ABDOMINAL HYSTERECTOMY  1 ovary removed  . APPENDECTOMY  8th grade  .  CHOLECYSTECTOMY  april 2013  . EYE SURGERY Left 05-25-2013   cataract lens replacement  . other surgery  2007   12 other surgeries due to ruptued aneurysm  . right knee meniscus repair  sept 30, 2016  . stent to heart      right rca 7024-2002  . surgery for ruptured abdominal aortic aneurysm  2007  . TONSILLECTOMY  7th grade  . TOTAL KNEE ARTHROPLASTY Right 04/11/2015   Procedure: TOTAL RIGHT KNEE ARTHROPLASTY;  Surgeon: Ranee Gosselin, MD;  Location: [facility];  Service: Orthopedics;  Laterality: Right;  . TUBAL LIGATION  1971    Social History   Socioeconomic History  . Marital status: Widowed    Spouse name: Not on file  . Number of children: 2  . Years of education: Not on file  . Highest education level: Not on file  Occupational History  . Not on file  Social Needs  . Financial resource strain: Not on file  . Food insecurity:    Worry: Not on file    Inability: Not on file  . Transportation needs:    Medical: Not on file    Non-medical: Not on file  Tobacco Use  . Smoking status: Former Smoker    Types: Cigarettes  . Smokeless tobacco: Never Used  . Tobacco comment: social smoker quit years ago  Substance and Sexual Activity  . Alcohol use: No  . Drug use: No  . Sexual activity: Not on  file  Lifestyle  . Physical activity:    Days per week: Not on file    Minutes per session: Not on file  . Stress: Not on file  Relationships  . Social connections:    Talks on phone: Not on file    Gets together: Not on file    Attends religious service: Not on file    Active member of club or organization: Not on file    Attends meetings of clubs or organizations: Not on file    Relationship status: Not on file  . Intimate partner violence:    Fear of current or ex partner: Not on file    Emotionally abused: Not on file    Physically abused: Not on file    Forced sexual activity: Not on file  Other Topics Concern  . Not on file  Social History Narrative  . Not on file      Family History  Problem Relation Age of Onset  . CAD Mother   . CAD Brother     ROS: Sciatica but no fevers or chills, productive cough, hemoptysis, dysphasia, odynophagia, melena, hematochezia, dysuria, hematuria, rash, seizure activity, orthopnea, PND, pedal edema, claudication. Remaining systems are negative.  Physical Exam: Well-developed well-nourished in no acute distress.  Skin is warm and dry.  HEENT is normal.  Neck is supple.  Chest is clear to auscultation with normal expansion.  Cardiovascular exam is regular rate and rhythm.  Abdominal exam nontender or distended. No masses palpated. Extremities show no edema. neuro grossly intact  ECG-sinus rhythm at a rate of 86, poor R wave progression.  Personally reviewed  A/P  1 coronary artery disease-patient denies chest pain.  Plan to continue medical therapy with aspirin.  She is intolerant to statins.  2 abdominal aneurysm-schedule follow-up ultrasound December 2019.  3 hypertension-patient's blood pressure is controlled.  Continue present medications and follow.  4 hyperlipidemia-patient is intolerant to statins.  I again discussed possible options for lipid therapy including repatha.  She is not interested in further medications.  Olga Millers, MD

## 2018-01-20 ENCOUNTER — Telehealth: Payer: Self-pay

## 2018-01-20 ENCOUNTER — Ambulatory Visit: Payer: Medicare Other | Admitting: Cardiology

## 2018-01-20 ENCOUNTER — Encounter: Payer: Self-pay | Admitting: Cardiology

## 2018-01-20 ENCOUNTER — Telehealth: Payer: Self-pay | Admitting: Cardiology

## 2018-01-20 VITALS — BP 136/78 | HR 86 | Ht 65.0 in | Wt 216.2 lb

## 2018-01-20 DIAGNOSIS — I10 Essential (primary) hypertension: Secondary | ICD-10-CM | POA: Diagnosis not present

## 2018-01-20 DIAGNOSIS — I2510 Atherosclerotic heart disease of native coronary artery without angina pectoris: Secondary | ICD-10-CM | POA: Diagnosis not present

## 2018-01-20 DIAGNOSIS — M545 Low back pain, unspecified: Secondary | ICD-10-CM | POA: Insufficient documentation

## 2018-01-20 DIAGNOSIS — I714 Abdominal aortic aneurysm, without rupture, unspecified: Secondary | ICD-10-CM

## 2018-01-20 DIAGNOSIS — E7800 Pure hypercholesterolemia, unspecified: Secondary | ICD-10-CM

## 2018-01-20 NOTE — Telephone Encounter (Signed)
New Message        Patient called to reschedule a  Saturday appt.( 02/28/2018) I didn't see a order for the US. Can we get order then schedule appointment? Pls call and advise.

## 2018-01-20 NOTE — Patient Instructions (Signed)
Medication Instructions:  Your physician recommends that you continue on your current medications as directed. Please refer to the Current Medication list given to you today.  If you need a refill on your cardiac medications before your next appointment, please call your pharmacy.   Lab work: None ordered If you have labs (blood work) drawn today and your tests are completely normal, you will receive your results only by: Marland Kitchen. MyChart Message (if you have MyChart) OR . A paper copy in the mail If you have any lab test that is abnormal or we need to change your treatment, we will call you to review the results.  Testing/Procedures: Your physician has requested that you have an abdominal aorta duplex. During this test, an ultrasound is used to evaluate the aorta. Allow 30 minutes for this exam. Do not eat after midnight the day before and avoid carbonated beverages (To be scheduled in Dec 2019)  Follow-Up: At [facility], you and your health needs are our priority.  As part of our continuing mission to provide you with exceptional heart care, we have created designated Provider Care Teams.  These Care Teams include your primary Cardiologist (physician) and Advanced Practice Providers (APPs -  Physician Assistants and Nurse Practitioners) who all work together to provide you with the care you need, when you need it. You will need a follow up appointment in 12 months.  Please call our office 2 months in advance to schedule this appointment.  You may see Dr.Crenshaw or one of the following Advanced Practice Providers on your designated Care Team:   Corine ShelterLuke Kilroy, PA-C Judy PimpleKrista Kroeger, [place]PA-C . Marjie Skiffallie Goodrich, PA-C  Any Other Special Instructions Will Be Listed Below (If Applicable).

## 2018-01-20 NOTE — Telephone Encounter (Signed)
error 

## 2018-01-21 ENCOUNTER — Other Ambulatory Visit: Payer: Self-pay | Admitting: Orthopedic Surgery

## 2018-01-21 ENCOUNTER — Telehealth: Payer: Self-pay | Admitting: Nurse Practitioner

## 2018-01-21 DIAGNOSIS — M545 Low back pain, unspecified: Secondary | ICD-10-CM

## 2018-01-21 DIAGNOSIS — G8929 Other chronic pain: Secondary | ICD-10-CM

## 2018-01-21 NOTE — Telephone Encounter (Signed)
Phone call to patient to verify medication list and allergies for myelogram procedure. Pt aware she will not need to hold any medications for procedure.  

## 2018-01-29 ENCOUNTER — Other Ambulatory Visit (HOSPITAL_COMMUNITY): Payer: Medicare Other

## 2018-01-29 ENCOUNTER — Other Ambulatory Visit: Payer: Medicare Other

## 2018-02-02 ENCOUNTER — Ambulatory Visit
Admission: RE | Admit: 2018-02-02 | Discharge: 2018-02-02 | Disposition: A | Discharge status: Home / Self Care | Payer: Medicare Other | Source: Ambulatory Visit | Attending: Orthopedic Surgery | Admitting: Orthopedic Surgery

## 2018-02-02 DIAGNOSIS — M545 Low back pain, unspecified: Secondary | ICD-10-CM

## 2018-02-02 DIAGNOSIS — G8929 Other chronic pain: Secondary | ICD-10-CM

## 2018-02-02 MED ORDER — ONDANSETRON HCL 4 MG/2ML IJ SOLN: 4.0000 mg | Freq: Four times a day (QID) | INTRAMUSCULAR | Status: DC | PRN

## 2018-02-02 MED ORDER — IOPAMIDOL (ISOVUE-M 300) INJECTION 61%
18.0000 mL | Freq: Once | INTRAMUSCULAR | Status: AC | PRN
  Administered 2018-02-02: 18 mL via INTRATHECAL

## 2018-02-02 MED ORDER — DIAZEPAM 5 MG PO TABS
5.0000 mg | ORAL_TABLET | Freq: Once | ORAL | Status: AC
  Administered 2018-02-02: 5 mg via ORAL

## 2018-02-02 NOTE — Discharge Instructions (Signed)

## 2018-02-18 ENCOUNTER — Telehealth: Payer: Self-pay | Admitting: Cardiology

## 2018-02-18 NOTE — Telephone Encounter (Signed)
Spoke to patient, patient had CT lumbar spine in November and was wondering if AAA is needed in December.    appt cancelled and RN will verify this is not needed based on CT results.  Patient aware

## 2018-02-18 NOTE — Telephone Encounter (Signed)
New message   Patient is scheduled for a AAA Duplex on 03/09/2018, the patient wants to know if this test is necessary since she has this test done at [facility] by Dr. Darrelyn HillockGioffre on Feb 02, 2018. Please advise.

## 2018-02-26 ENCOUNTER — Other Ambulatory Visit: Payer: Self-pay | Admitting: *Deleted

## 2018-02-26 DIAGNOSIS — I714 Abdominal aortic aneurysm, without rupture, unspecified: Secondary | ICD-10-CM

## 2018-02-26 NOTE — Telephone Encounter (Signed)
Spoke with pt, aware no follow up needed for AAA at this time due to CT scan from 02-02-18. Dr Jens Somcrenshaw reviewed scan and a follow up ultrasound will be done in one year. Order placed

## 2018-03-09 ENCOUNTER — Other Ambulatory Visit (HOSPITAL_COMMUNITY): Payer: Medicare Other

## 2018-04-09 DIAGNOSIS — M5136 Other intervertebral disc degeneration, lumbar region: Secondary | ICD-10-CM | POA: Insufficient documentation

## 2019-03-01 ENCOUNTER — Other Ambulatory Visit (HOSPITAL_COMMUNITY): Payer: Medicare Other

## 2019-03-01 ENCOUNTER — Ambulatory Visit: Payer: Medicare Other | Admitting: Cardiology

## 2019-04-09 ENCOUNTER — Ambulatory Visit (HOSPITAL_COMMUNITY)
Admission: RE | Admit: 2019-04-09 | Discharge: 2019-04-09 | Disposition: A | Discharge status: Home / Self Care | Payer: Medicare Other | Source: Ambulatory Visit | Attending: Internal Medicine | Admitting: Internal Medicine

## 2019-04-09 ENCOUNTER — Other Ambulatory Visit (HOSPITAL_COMMUNITY): Payer: Self-pay | Admitting: Cardiology

## 2019-04-09 ENCOUNTER — Other Ambulatory Visit: Payer: Self-pay

## 2019-04-09 ENCOUNTER — Encounter (INDEPENDENT_AMBULATORY_CARE_PROVIDER_SITE_OTHER): Payer: Self-pay

## 2019-04-09 DIAGNOSIS — I714 Abdominal aortic aneurysm, without rupture, unspecified: Secondary | ICD-10-CM

## 2019-04-09 DIAGNOSIS — I77819 Aortic ectasia, unspecified site: Secondary | ICD-10-CM

## 2019-04-12 ENCOUNTER — Encounter: Payer: Self-pay | Admitting: *Deleted

## 2019-04-12 ENCOUNTER — Other Ambulatory Visit: Payer: Self-pay | Admitting: *Deleted

## 2019-04-12 DIAGNOSIS — I714 Abdominal aortic aneurysm, without rupture, unspecified: Secondary | ICD-10-CM

## 2019-04-13 ENCOUNTER — Ambulatory Visit: Payer: Medicare Other | Admitting: Cardiology

## 2019-04-13 ENCOUNTER — Other Ambulatory Visit: Payer: Self-pay

## 2019-04-13 ENCOUNTER — Encounter: Payer: Self-pay | Admitting: Cardiology

## 2019-04-13 VITALS — BP 124/76 | HR 77 | Temp 95.9°F | Ht 65.0 in | Wt 212.0 lb

## 2019-04-13 DIAGNOSIS — E7800 Pure hypercholesterolemia, unspecified: Secondary | ICD-10-CM | POA: Diagnosis not present

## 2019-04-13 DIAGNOSIS — I714 Abdominal aortic aneurysm, without rupture, unspecified: Secondary | ICD-10-CM

## 2019-04-13 DIAGNOSIS — I10 Essential (primary) hypertension: Secondary | ICD-10-CM

## 2019-04-13 DIAGNOSIS — I2510 Atherosclerotic heart disease of native coronary artery without angina pectoris: Secondary | ICD-10-CM | POA: Diagnosis not present

## 2019-04-13 NOTE — Progress Notes (Signed)
HPI: FUCAD. Previously followed in [place]. 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 Catheterization revealed ejection fraction 40-45% with inferior akinesis. There was a 50% LAD. There was a 99% focal RCA at right ventricular branch. There was a 50% PDA. Patient had PCI of RCA.She has a history of peripheral vascular disease and ruptured abdominal aortic aneurysm. Echocardiogram September 2016 showed normal LV function and no significant valvular disease by report. Nuclear study September 2016 showed ejection fraction 41% with mild inferior hypokinesis. No ischemia. Abdominal ultrasound January 2021 showed abdominal aortic aneurysm measuring 3.1 cm. Since last seen, she has dyspnea on exertion.  No orthopnea or PND.  She does have occasional pedal edema left lower extremity.  She denies chest pain or syncope.  Current Outpatient Medications  Medication Sig Dispense Refill  . amLODipine (NORVASC) 5 MG tablet Take 1 tablet (5 mg total) by mouth daily. 90 tablet 3  . aspirin 81 MG tablet Take 81 mg by mouth daily.    . cholecalciferol (VITAMIN D) 1000 units tablet Take 1,000 Units by mouth daily.    . hydrochlorothiazide (HYDRODIURIL) 25 MG tablet Take 12.5 mg by mouth daily.  0  . metoprolol succinate (TOPROL-XL) 50 MG 24 hr tablet Take 50 mg by mouth daily.  1  . vitamin B-12 (CYANOCOBALAMIN) 1000 MCG tablet Take 1,000 mcg by mouth daily.    . vitamin C (ASCORBIC ACID) 500 MG tablet Take 500 mg by mouth daily.     No current facility-administered medications for this visit.     Past Medical History:  Diagnosis Date  . CAD (coronary artery disease)   . Hyperlipidemia   . Hypertension   . Myocardial infarction [facility]) 2002    Past Surgical History:  Procedure Laterality Date  . ABDOMINAL HYSTERECTOMY  1 ovary removed  . APPENDECTOMY  8th grade  . CHOLECYSTECTOMY  april 2013  . EYE SURGERY Left 05-25-2013   cataract lens replacement  . other surgery  2007   12  other surgeries due to ruptued aneurysm  . right knee meniscus repair  sept 30, 2016  . stent to heart      right rca 7024-2002  . surgery for ruptured abdominal aortic aneurysm  2007  . TONSILLECTOMY  7th grade  . TOTAL KNEE ARTHROPLASTY Right 04/11/2015   Procedure: TOTAL RIGHT KNEE ARTHROPLASTY;  Surgeon: Ranee Gosselin, MD;  Location: [facility];  Service: Orthopedics;  Laterality: Right;  . TUBAL LIGATION  1971    Social History   Socioeconomic History  . Marital status: Widowed    Spouse name: Not on file  . Number of children: 2  . Years of education: Not on file  . Highest education level: Not on file  Occupational History  . Not on file  Tobacco Use  . Smoking status: Former Smoker    Types: Cigarettes  . Smokeless tobacco: Never Used  . Tobacco comment: social smoker quit years ago  Substance and Sexual Activity  . Alcohol use: No  . Drug use: No  . Sexual activity: Not on file  Other Topics Concern  . Not on file  Social History Narrative  . Not on file   Social Determinants of Health   Financial Resource Strain:   . Difficulty of Paying Living Expenses: Not on file  Food Insecurity:   . Worried About Programme researcher, broadcasting/film/video in the Last Year: Not on file  . Ran Out of Food in the Last Year:  Not on file  Transportation Needs:   . Lack of Transportation (Medical): Not on file  . Lack of Transportation (Non-Medical): Not on file  Physical Activity:   . Days of Exercise per Week: Not on file  . Minutes of Exercise per Session: Not on file  Stress:   . Feeling of Stress : Not on file  Social Connections:   . Frequency of Communication with Friends and Family: Not on file  . Frequency of Social Gatherings with Friends and Family: Not on file  . Attends Religious Services: Not on file  . Active Member of Clubs or Organizations: Not on file  . Attends Archivist Meetings: Not on file  . Marital Status: Not on file  Intimate Partner Violence:   . Fear of  Current or Ex-Partner: Not on file  . Emotionally Abused: Not on file  . Physically Abused: Not on file  . Sexually Abused: Not on file    Family History  Problem Relation Age of Onset  . CAD Mother   . CAD Brother     ROS: Back and hip pain but no fevers or chills, productive cough, hemoptysis, dysphasia, odynophagia, melena, hematochezia, dysuria, hematuria, rash, seizure activity, orthopnea, PND, claudication. Remaining systems are negative.  Physical Exam: Well-developed well-nourished in no acute distress.  Skin is warm and dry.  HEENT is normal.  Neck is supple.  Chest is clear to auscultation with normal expansion.  Cardiovascular exam is regular rate and rhythm.  Abdominal exam nontender or distended. No masses palpated. Extremities show trace edema LLE. neuro grossly intact  ECG-sinus rhythm at a rate of 77, normal axis, poor R wave progression, cannot rule out inferior infarct.  Personally reviewed  A/P  1 coronary artery disease-no chest pain.  Continue medical therapy with aspirin.  She is intolerant to statins.  Repeat echocardiogram.  If ejection fraction reduced would need to consider addition of ARB.  2 abdominal aortic aneurysm-plan follow-up ultrasound January 2022.  3 hypertension-blood pressure controlled.  Continue present medical regimen.  4 hyperlipidemia-patient is intolerant to statins.  She declines other lipid-lowering therapy.  Continue diet.  Kirk Ruths, MD

## 2019-04-13 NOTE — Patient Instructions (Signed)
Medication Instructions:  NO CHANGE *If you need a refill on your cardiac medications before your next appointment, please call your pharmacy*  Lab Work: If you have labs (blood work) drawn today and your tests are completely normal, you will receive your results only by: . MyChart Message (if you have MyChart) OR . A paper copy in the mail If you have any lab test that is abnormal or we need to change your treatment, we will call you to review the results.  Testing/Procedures: Your physician has requested that you have an echocardiogram. Echocardiography is a painless test that uses sound waves to create images of your heart. It provides your doctor with information about the size and shape of your heart and how well your heart's chambers and valves are working. This procedure takes approximately one hour. There are no restrictions for this procedure.[address]    Follow-Up: At [facility], you and your health needs are our priority.  As part of our continuing mission to provide you with exceptional heart care, we have created designated Provider Care Teams.  These Care Teams include your primary Cardiologist (physician) and Advanced Practice Providers (APPs -  Physician Assistants and Nurse Practitioners) who all work together to provide you with the care you need, when you need it.  Your next appointment:   6 month(s)  The format for your next appointment:   Either In Person or Virtual  Provider:   You may see BRIAN CRENSHAW MD or one of the following Advanced Practice Providers on your designated Care Team:    Luke Kilroy, PA-C  Callie Goodrich, PA-C  Jesse Cleaver, FNP    

## 2019-04-26 ENCOUNTER — Other Ambulatory Visit (HOSPITAL_COMMUNITY): Payer: Medicare Other

## 2019-05-10 ENCOUNTER — Other Ambulatory Visit: Payer: Self-pay

## 2019-05-10 ENCOUNTER — Encounter (INDEPENDENT_AMBULATORY_CARE_PROVIDER_SITE_OTHER): Payer: Self-pay

## 2019-05-10 ENCOUNTER — Ambulatory Visit (HOSPITAL_COMMUNITY): Payer: Medicare Other | Attending: Cardiology

## 2019-05-10 DIAGNOSIS — I2510 Atherosclerotic heart disease of native coronary artery without angina pectoris: Secondary | ICD-10-CM | POA: Insufficient documentation

## 2019-11-12 ENCOUNTER — Ambulatory Visit: Payer: Medicare Other | Admitting: Cardiology

## 2019-12-28 ENCOUNTER — Encounter: Payer: Self-pay | Admitting: Pulmonary Disease

## 2019-12-28 ENCOUNTER — Ambulatory Visit (INDEPENDENT_AMBULATORY_CARE_PROVIDER_SITE_OTHER): Payer: Medicare Other

## 2019-12-28 ENCOUNTER — Other Ambulatory Visit: Payer: Self-pay

## 2019-12-28 ENCOUNTER — Ambulatory Visit: Payer: Medicare Other | Admitting: Pulmonary Disease

## 2019-12-28 VITALS — BP 128/72 | HR 76 | Temp 97.1°F | Ht 65.0 in | Wt 210.8 lb

## 2019-12-28 DIAGNOSIS — J3489 Other specified disorders of nose and nasal sinuses: Secondary | ICD-10-CM | POA: Diagnosis not present

## 2019-12-28 DIAGNOSIS — R059 Cough, unspecified: Secondary | ICD-10-CM

## 2019-12-28 MED ORDER — FLUTICASONE PROPIONATE 50 MCG/ACT NA SUSP: 1.0000 | Freq: Every day | NASAL | 2 refills | Status: DC

## 2019-12-28 NOTE — Patient Instructions (Addendum)
Start flonase nasal spray daily, 1 spray per nostril Call in 3-4 weeks if symptoms are not improved We will check a chest xray today

## 2019-12-28 NOTE — Progress Notes (Signed)
Synopsis: Referred by Rebekah Sable, MD  for chronic cough  Subjective:   PATIENT ID: Rebekah Skinner GENDER: female DOB: 09-26-1940, MRN: [id number]   HPI  Chief Complaint  Patient presents with  . Consult    productive cough with clear and/or yellow phlegm   Rebekah Skinner is a 79 year old woman, former smoker with coronary artery disease and hypertension who is referred to pulmonary clinic for chronic cough.   She reports having a cough for "too long" and coughs up clear/yellow sputum occasionally. She reports seasonal allergies that are worse in the Spring which cause sneezing. She does have sinus congestion and drainage. She denies chest tightness or wheezing. She denies shortness of breath. She denies heart burn symptoms and previously tried prilosec for a month without improvement in her cough. She reports having covid in 02/2019 and was sick for 3 weeks. She did not require hospitalization. She reports having the cough prior to covid.     Past Medical History:  Diagnosis Date  . CAD (coronary artery disease)   . Hyperlipidemia   . Hypertension   . Myocardial infarction [facility]) 2002     Family History  Problem Relation Age of Onset  . CAD Mother   . CAD Brother      Social History   Socioeconomic History  . Marital status: Widowed    Spouse name: Not on file  . Number of children: 2  . Years of education: Not on file  . Highest education level: Not on file  Occupational History  . Not on file  Tobacco Use  . Smoking status: Former Smoker    Packs/day: 0.50    Years: 20.00    Pack years: 10.00    Types: Cigarettes    Quit date: 2000    Years since quitting: 21.8  . Smokeless tobacco: Never Used  Vaping Use  . Vaping Use: Never used  Substance and Sexual Activity  . Alcohol use: No  . Drug use: No  . Sexual activity: Not on file  Other Topics Concern  . Not on file  Social History Narrative  . Not on file   Social Determinants of Health    Financial Resource Strain:   . Difficulty of Paying Living Expenses: Not on file  Food Insecurity:   . Worried About Programme researcher, broadcasting/film/video in the Last Year: Not on file  . Ran Out of Food in the Last Year: Not on file  Transportation Needs:   . Lack of Transportation (Medical): Not on file  . Lack of Transportation (Non-Medical): Not on file  Physical Activity:   . Days of Exercise per Week: Not on file  . Minutes of Exercise per Session: Not on file  Stress:   . Feeling of Stress : Not on file  Social Connections:   . Frequency of Communication with Friends and Family: Not on file  . Frequency of Social Gatherings with Friends and Family: Not on file  . Attends Religious Services: Not on file  . Active Member of Clubs or Organizations: Not on file  . Attends Banker Meetings: Not on file  . Marital Status: Not on file  Intimate Partner Violence:   . Fear of Current or Ex-Partner: Not on file  . Emotionally Abused: Not on file  . Physically Abused: Not on file  . Sexually Abused: Not on file     Allergies  Allergen Reactions  . Bactrim Ds [Sulfamethoxazole-Trimethoprim] Nausea And Vomiting  .  Darvon [Propoxyphene] Nausea And Vomiting  . Docusate Nausea And Vomiting  . Lisinopril     Other reaction(s): Cough  . Other     Darvocet=nausea vomiting  . Zyvox [Linezolid] Nausea And Vomiting     Outpatient Medications Prior to Visit  Medication Sig Dispense Refill  . amLODipine (NORVASC) 5 MG tablet Take 1 tablet (5 mg total) by mouth daily. 90 tablet 3  . aspirin 81 MG tablet Take 81 mg by mouth daily.    . cholecalciferol (VITAMIN D) 1000 units tablet Take 1,000 Units by mouth daily.    . hydrochlorothiazide (HYDRODIURIL) 25 MG tablet Take 12.5 mg by mouth daily.  0  . metoprolol succinate (TOPROL-XL) 50 MG 24 hr tablet Take 50 mg by mouth daily.  1  . rosuvastatin (CRESTOR) 10 MG tablet Take 10 mg by mouth at bedtime.    . vitamin B-12 (CYANOCOBALAMIN) 1000  MCG tablet Take 1,000 mcg by mouth daily.    . vitamin C (ASCORBIC ACID) 500 MG tablet Take 500 mg by mouth daily.     No facility-administered medications prior to visit.    Review of Systems  Constitutional: Negative for chills, diaphoresis, fever, malaise/fatigue and weight loss.  HENT: Negative for congestion, sinus pain and sore throat.   Eyes: Negative for blurred vision.  Respiratory: Positive for cough and sputum production. Negative for hemoptysis, shortness of breath and wheezing.   Cardiovascular: Negative for chest pain, palpitations, orthopnea, claudication, leg swelling and PND.  Gastrointestinal: Negative for abdominal pain, blood in stool, constipation, diarrhea, heartburn, nausea and vomiting.  Genitourinary: Negative for dysuria and hematuria.  Musculoskeletal: Negative for joint pain and myalgias.  Skin: Negative for itching and rash.  Neurological: Negative for dizziness, weakness and headaches.  Endo/Heme/Allergies: Does not bruise/bleed easily.  Psychiatric/Behavioral: Negative.     Objective:   Vitals:   12/28/19 1508  BP: 128/72  Pulse: 76  Temp: (!) 97.1 F (36.2 C)  TempSrc: Other (Comment)  SpO2: 97%  Weight: 210 lb 12.8 oz (95.6 kg)  Height: 5\' 5"  (1.651 m)     Physical Exam Constitutional:      General: She is not in acute distress.    Appearance: She is obese. She is not ill-appearing.  HENT:     Head: Normocephalic and atraumatic.     Nose: Nose normal.     Mouth/Throat:     Mouth: Mucous membranes are moist.     Pharynx: Oropharynx is clear.  Eyes:     General: No scleral icterus.    Conjunctiva/sclera: Conjunctivae normal.     Pupils: Pupils are equal, round, and reactive to light.  Cardiovascular:     Rate and Rhythm: Normal rate and regular rhythm.     Pulses: Normal pulses.     Heart sounds: Normal heart sounds. No murmur heard.   Pulmonary:     Effort: Pulmonary effort is normal.     Breath sounds: Normal breath sounds. No  wheezing, rhonchi or rales.  Abdominal:     General: Bowel sounds are normal.     Palpations: Abdomen is soft.  Musculoskeletal:     Cervical back: Neck supple.     Right lower leg: No edema.     Left lower leg: No edema.  Lymphadenopathy:     Cervical: No cervical adenopathy.  Skin:    Findings: No lesion or rash.  Neurological:     General: No focal deficit present.     Mental Status: She is alert  and oriented to person, place, and time. Mental status is at baseline.  Psychiatric:        Mood and Affect: Mood normal.        Behavior: Behavior normal.        Thought Content: Thought content normal.        Judgment: Judgment normal.     CBC    Component Value Date/Time   WBC 10.9 (H) 04/13/2015 0426   RBC 2.96 (L) 04/13/2015 0426   HGB 9.0 (L) 04/13/2015 0426   HCT 29.7 (L) 04/13/2015 0426   PLT 202 04/13/2015 0426   MCV 100.3 (H) 04/13/2015 0426   MCH 30.4 04/13/2015 0426   MCHC 30.3 04/13/2015 0426   RDW 14.9 04/13/2015 0426   LYMPHSABS 3.0 04/07/2015 1340   MONOABS 0.8 04/07/2015 1340   EOSABS 0.1 04/07/2015 1340   BASOSABS 0.0 04/07/2015 1340   BMP Latest Ref Rng & Units 04/13/2015 04/12/2015 04/07/2015  Glucose 65 - 99 mg/dL 098(J130(H) 191(Y135(H) 88  BUN 6 - 20 mg/dL 78(G26(H) 19 95(A29(H)  Creatinine 0.44 - 1.00 mg/dL 2.13(Y1.12(H) 8.65(H1.12(H) 8.46(N1.25(H)  Sodium 135 - 145 mmol/L 140 136 137  Potassium 3.5 - 5.1 mmol/L 4.9 4.7 5.1  Chloride 101 - 111 mmol/L 108 106 104  CO2 22 - 32 mmol/L 26 22 22   Calcium 8.9 - 10.3 mg/dL 8.0(L) 8.0(L) 9.0     Chest imaging: CXR 12/28/19 Upper normal heart size.  Atherosclerotic calcifications of a tortuous thoracic aorta. Mediastinal contours and pulmonary vascularity otherwise normal. Lungs clear. No pulmonary infiltrate, pleural effusion or pneumothorax. Osseous structures unremarkable.  PFT: None on file  Echo: 05/10/2019 1. Left ventricular ejection fraction, by estimation, is 50 to 55%. The  left ventricle has low normal function. The left  ventricle has no regional  wall motion abnormalities. There is mild left ventricular hypertrophy.  Left ventricular diastolic  parameters are consistent with Grade I diastolic dysfunction (impaired  relaxation). The average left ventricular global longitudinal strain is  -19.3 %.  2. Right ventricular systolic function is mildly reduced. The right  ventricular size is normal. Tricuspid regurgitation signal is inadequate  for assessing PA pressure.  3. The mitral valve is normal in structure and function. No evidence of  mitral valve regurgitation.  4. The aortic valve was not well visualized. Aortic valve regurgitation  is not visualized. No aortic stenosis is present.  5. Aortic dilatation noted. 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  6. The inferior vena cava is normal in size with greater than 50%  respiratory variability, suggesting right atrial pressure of 3 mmHg.  Heart Catheterization:   Assessment & Plan:   Cough - Plan: DG Chest 2 View  Sinus drainage  Discussion: Rebekah Skinner is a 79 year old woman, former smoker with coronary artery disease and hypertension who is referred to pulmonary clinic for chronic cough.   Her cough appears to secondary to sinus congestion and post-nasal drainage. She is to start flonase 1 spray per nostril daily. We can consider staring her on allegra or cetirizine in the future for allergies. She may also benefit from ipratropium nasal spray if she continues to have sinus/nasal drainage.  She is to follow up in 6 months and is to call should she have any issues prior to her next appointment.  Melody ComasJonathan Oluwaseyi Raffel, MD [facility] Office: [phone]   See Amion for Pager Details    Current Outpatient Medications:  .  amLODipine (NORVASC) 5 MG tablet, Take 1 tablet (5 mg total) by mouth daily., Disp: 90 tablet, Rfl: 3 .  aspirin 81 MG tablet, Take 81 mg by mouth daily., Disp: , Rfl:  .   cholecalciferol (VITAMIN D) 1000 units tablet, Take 1,000 Units by mouth daily., Disp: , Rfl:  .  hydrochlorothiazide (HYDRODIURIL) 25 MG tablet, Take 12.5 mg by mouth daily., Disp: , Rfl: 0 .  metoprolol succinate (TOPROL-XL) 50 MG 24 hr tablet, Take 50 mg by mouth daily., Disp: , Rfl: 1 .  rosuvastatin (CRESTOR) 10 MG tablet, Take 10 mg by mouth at bedtime., Disp: , Rfl:  .  vitamin B-12 (CYANOCOBALAMIN) 1000 MCG tablet, Take 1,000 mcg by mouth daily., Disp: , Rfl:  .  vitamin C (ASCORBIC ACID) 500 MG tablet, Take 500 mg by mouth daily., Disp: , Rfl:  .  fluticasone (FLONASE) 50 MCG/ACT nasal spray, Place 1 spray into both nostrils daily., Disp: 16 g, Rfl: 2

## 2020-01-11 NOTE — Progress Notes (Signed)
HPI: FUCAD. Previously followed in [place]. 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 Catheterization revealed ejection fraction 40-45% with inferior akinesis. There was a 50% LAD. There was a 99% focal RCA at right ventricular branch. There was a 50% PDA. Patient had PCI of RCA.She has a history of peripheral vascular disease and ruptured abdominal aortic aneurysm. Nuclear study September 2016 showed ejection fraction 41% with mild inferior hypokinesis. No ischemia. Abdominal ultrasound January 2021 showed abdominal aortic aneurysm measuring 3.1 cm.  Echocardiogram March 2021 showed normal LV function, mild left ventricular hypertrophy, grade 1 diastolic dysfunction, mild RV dysfunction.  Since last seen,  she has some dyspnea on exertion unchanged.  No orthopnea or PND.  Mild pedal edema.  No chest pain or syncope.  Current Outpatient Medications  Medication Sig Dispense Refill  . amLODipine (NORVASC) 5 MG tablet Take 1 tablet (5 mg total) by mouth daily. 90 tablet 3  . aspirin 81 MG tablet Take 81 mg by mouth daily.    . cholecalciferol (VITAMIN D) 1000 units tablet Take 1,000 Units by mouth daily.    . hydrochlorothiazide (HYDRODIURIL) 25 MG tablet Take 12.5 mg by mouth daily.  0  . metoprolol succinate (TOPROL-XL) 50 MG 24 hr tablet Take 50 mg by mouth daily.  1  . rosuvastatin (CRESTOR) 10 MG tablet Take 10 mg by mouth at bedtime.    . vitamin B-12 (CYANOCOBALAMIN) 1000 MCG tablet Take 1,000 mcg by mouth daily.    . vitamin C (ASCORBIC ACID) 500 MG tablet Take 500 mg by mouth daily.     No current facility-administered medications for this visit.     Past Medical History:  Diagnosis Date  . CAD (coronary artery disease)   . Hyperlipidemia   . Hypertension   . Myocardial infarction [facility]) 2002    Past Surgical History:  Procedure Laterality Date  . ABDOMINAL HYSTERECTOMY  1 ovary removed  . APPENDECTOMY  8th grade  . CHOLECYSTECTOMY  april 2013  . EYE  SURGERY Left 05-25-2013   cataract lens replacement  . other surgery  2007   12 other surgeries due to ruptued aneurysm  . right knee meniscus repair  sept 30, 2016  . stent to heart      right rca 7024-2002  . surgery for ruptured abdominal aortic aneurysm  2007  . TONSILLECTOMY  7th grade  . TOTAL KNEE ARTHROPLASTY Right 04/11/2015   Procedure: TOTAL RIGHT KNEE ARTHROPLASTY;  Surgeon: Ranee Gosselin, MD;  Location: [facility];  Service: Orthopedics;  Laterality: Right;  . TUBAL LIGATION  1971    Social History   Socioeconomic History  . Marital status: Widowed    Spouse name: Not on file  . Number of children: 2  . Years of education: Not on file  . Highest education level: Not on file  Occupational History  . Not on file  Tobacco Use  . Smoking status: Former Smoker    Packs/day: 0.50    Years: 20.00    Pack years: 10.00    Types: Cigarettes    Quit date: 2000    Years since quitting: 21.8  . Smokeless tobacco: Never Used  Vaping Use  . Vaping Use: Never used  Substance and Sexual Activity  . Alcohol use: No  . Drug use: No  . Sexual activity: Not on file  Other Topics Concern  . Not on file  Social History Narrative  . Not on file   Social Determinants  of Health   Financial Resource Strain:   . Difficulty of Paying Living Expenses: Not on file  Food Insecurity:   . Worried About Programme researcher, broadcasting/film/video in the Last Year: Not on file  . Ran Out of Food in the Last Year: Not on file  Transportation Needs:   . Lack of Transportation (Medical): Not on file  . Lack of Transportation (Non-Medical): Not on file  Physical Activity:   . Days of Exercise per Week: Not on file  . Minutes of Exercise per Session: Not on file  Stress:   . Feeling of Stress : Not on file  Social Connections:   . Frequency of Communication with Friends and Family: Not on file  . Frequency of Social Gatherings with Friends and Family: Not on file  . Attends Religious Services: Not on file  .  Active Member of Clubs or Organizations: Not on file  . Attends Banker Meetings: Not on file  . Marital Status: Not on file  Intimate Partner Violence:   . Fear of Current or Ex-Partner: Not on file  . Emotionally Abused: Not on file  . Physically Abused: Not on file  . Sexually Abused: Not on file    Family History  Problem Relation Age of Onset  . CAD Mother   . CAD Brother     ROS: no fevers or chills, productive cough, hemoptysis, dysphasia, odynophagia, melena, hematochezia, dysuria, hematuria, rash, seizure activity, orthopnea, PND, pedal edema, claudication. Remaining systems are negative.  Physical Exam: Well-developed well-nourished in no acute distress.  Skin is warm and dry.  HEENT is normal.  Neck is supple.  Chest is clear to auscultation with normal expansion.  Cardiovascular exam is regular rate and rhythm.  Abdominal exam nontender or distended. No masses palpated. Extremities show no edema. neuro grossly intact  ECG- personally reviewed  A/P  1 coronary artery disease-patient denies chest pain.  Continue aspirin and statin. Most recent echocardiogram showed preserved LV function.  2 hyperlipidemia-continue low-dose Crestor.  She did not tolerate other statins in the past does not want to try higher dose Crestor.  3 hypertension-blood pressure borderline; I have asked her to follow this at home and we will advance medications as needed.  4 abdominal aortic aneurysm-she will need a follow-up ultrasound January 2022.  Olga Millers, MD

## 2020-01-19 ENCOUNTER — Telehealth: Payer: Self-pay | Admitting: Pulmonary Disease

## 2020-01-19 ENCOUNTER — Other Ambulatory Visit: Payer: Self-pay

## 2020-01-19 ENCOUNTER — Encounter: Payer: Self-pay | Admitting: Cardiology

## 2020-01-19 ENCOUNTER — Ambulatory Visit: Payer: Medicare Other | Admitting: Cardiology

## 2020-01-19 VITALS — BP 142/68 | HR 75 | Ht 65.0 in | Wt 208.9 lb

## 2020-01-19 DIAGNOSIS — I714 Abdominal aortic aneurysm, without rupture, unspecified: Secondary | ICD-10-CM

## 2020-01-19 DIAGNOSIS — I2510 Atherosclerotic heart disease of native coronary artery without angina pectoris: Secondary | ICD-10-CM | POA: Diagnosis not present

## 2020-01-19 DIAGNOSIS — I10 Essential (primary) hypertension: Secondary | ICD-10-CM

## 2020-01-19 MED ORDER — IPRATROPIUM BROMIDE 0.03 % NA SOLN: 2.0000 | Freq: Two times a day (BID) | NASAL | 12 refills | Status: DC

## 2020-01-19 NOTE — Telephone Encounter (Signed)
Please have her start taking fexofenadine (allegra) 180mg  PO daily for her seasonal allergies. She is to also start ipratropium nasal spray 0.03% 2 sprays per nostril twice daily (order sent to [facility]). The ipratropium will hopefully help dry up her nasal secretions/drainage. She is to continue the flonase nasal spray as well.  Thanks,  

## 2020-01-19 NOTE — Patient Instructions (Signed)

## 2020-01-19 NOTE — Telephone Encounter (Signed)
Called patient regarding Flonase, states she is still coughing up a lot of mucus.  The Flonase in not doing anything to help.  She is asking if there is something else she can do?  I let her know that Dr. Francine Graven is out of the office the rest of the week and it would likely be next week before we hear back from him and once we hear back from him, we will call her back.  She verbalized understanding.  Dr. Francine Graven, please advise.

## 2020-01-19 NOTE — Telephone Encounter (Signed)
ATC patient x1, left detailed message for patient regarding instructions and medications per Dr. Francine Graven.  Left message to call back with any questions.

## 2020-01-20 MED ORDER — IPRATROPIUM BROMIDE 0.03 % NA SOLN: 2.0000 | Freq: Two times a day (BID) | NASAL | 12 refills | Status: AC

## 2020-01-20 NOTE — Telephone Encounter (Signed)
Called and spoke with pt letting her know the info stated by Dr. Francine Graven. Pt verbalized understanding but stated that the ipratropium nasal spray was sent to the wrong pharmacy. I have sent it to the correct pharmacy for pt. Nothing further needed.

## 2020-01-24 ENCOUNTER — Telehealth: Payer: Self-pay | Admitting: Pulmonary Disease

## 2020-01-24 NOTE — Telephone Encounter (Signed)
Spoke with pt and advised that Allegra 180 mg is one tablet daily. And Atrovent nasal spray was prescribed for 2 sprays each nostril every 12 hrs. Pt verbalized understanding. Nothing further needed.

## 2020-02-24 ENCOUNTER — Ambulatory Visit: Payer: Medicare Other | Admitting: Sports Medicine

## 2020-02-24 ENCOUNTER — Ambulatory Visit (INDEPENDENT_AMBULATORY_CARE_PROVIDER_SITE_OTHER): Payer: Medicare Other

## 2020-02-24 ENCOUNTER — Encounter: Payer: Self-pay | Admitting: Sports Medicine

## 2020-02-24 ENCOUNTER — Other Ambulatory Visit: Payer: Self-pay | Admitting: Sports Medicine

## 2020-02-24 ENCOUNTER — Other Ambulatory Visit: Payer: Self-pay

## 2020-02-24 DIAGNOSIS — M722 Plantar fascial fibromatosis: Secondary | ICD-10-CM

## 2020-02-24 DIAGNOSIS — M79671 Pain in right foot: Secondary | ICD-10-CM

## 2020-02-24 DIAGNOSIS — R52 Pain, unspecified: Secondary | ICD-10-CM

## 2020-02-24 DIAGNOSIS — L608 Other nail disorders: Secondary | ICD-10-CM | POA: Diagnosis not present

## 2020-02-24 DIAGNOSIS — M79672 Pain in left foot: Secondary | ICD-10-CM

## 2020-02-24 MED ORDER — PREDNISONE 10 MG (21) PO TBPK: ORAL_TABLET | ORAL | 0 refills | Status: DC

## 2020-02-24 NOTE — Patient Instructions (Signed)
Plantar Fasciitis Rehab Ask your health care provider which exercises are safe for you. Do exercises exactly as told by your health care provider and adjust them as directed. It is normal to feel mild stretching, pulling, tightness, or discomfort as you do these exercises. Stop right away if you feel sudden pain or your pain gets worse. Do not begin these exercises until told by your health care provider. Stretching and range-of-motion exercises These exercises warm up your muscles and joints and improve the movement and flexibility of your foot. These exercises also help to relieve pain. Plantar fascia stretch  1. Sit with your left / right leg crossed over your opposite knee. 2. Hold your heel with one hand with that thumb near your arch. With your other hand, hold your toes and gently pull them back toward the top of your foot. You should feel a stretch on the bottom of your toes or your foot (plantar fascia) or both. 3. Hold this stretch for__________ seconds. 4. Slowly release your toes and return to the starting position. Repeat __________ times. Complete this exercise __________ times a day. Gastrocnemius stretch, standing This exercise is also called a calf (gastroc) stretch. It stretches the muscles in the back of the upper calf. 1. Stand with your hands against a wall. 2. Extend your left / right leg behind you, and bend your front knee slightly. 3. Keeping your heels on the floor and your back knee straight, shift your weight toward the wall. Do not arch your back. You should feel a gentle stretch in your upper left / right calf. 4. Hold this position for __________ seconds. Repeat __________ times. Complete this exercise __________ times a day. Soleus stretch, standing This exercise is also called a calf (soleus) stretch. It stretches the muscles in the back of the lower calf. 1. Stand with your hands against a wall. 2. Extend your left / right leg behind you, and bend your front  knee slightly. 3. Keeping your heels on the floor, bend your back knee and shift your weight slightly over your back leg. You should feel a gentle stretch deep in your lower calf. 4. Hold this position for __________ seconds. Repeat __________ times. Complete this exercise __________ times a day. Gastroc and soleus stretch, standing step This exercise stretches the muscles in the back of the lower leg. These muscles are in the upper calf (gastrocnemius) and the lower calf (soleus). 1. Stand with the ball of your left / right foot on a step. The ball of your foot is on the walking surface, right under your toes. 2. Keep your other foot firmly on the same step. 3. Hold on to the wall or a railing for balance. 4. Slowly lift your other foot, allowing your body weight to press your left / right heel down over the edge of the step. You should feel a stretch in your left / right calf. 5. Hold this position for __________ seconds. 6. Return both feet to the step. 7. Repeat this exercise with a slight bend in your left / right knee. Repeat __________ times with your left / right knee straight and __________ times with your left / right knee bent. Complete this exercise __________ times a day. Balance exercise This exercise builds your balance and strength control of your arch to help take pressure off your plantar fascia. Single leg stand If this exercise is too easy, you can try it with your eyes closed or while standing on a pillow. 1.   Without shoes, stand near a railing or in a doorway. You may hold on to the railing or door frame as needed. 2. Stand on your left / right foot. Keep your big toe down on the floor and try to keep your arch lifted. Do not let your foot roll inward. 3. Hold this position for __________ seconds. Repeat __________ times. Complete this exercise __________ times a day. This information is not intended to replace advice given to you by your health care provider. Make sure  you discuss any questions you have with your health care provider. Document Revised: 06/18/2018 Document Reviewed: 12/24/2017 [employer] Patient Education  2020 [employer]  

## 2020-02-24 NOTE — Progress Notes (Signed)
Subjective: Rebekah Skinner is a 79 y.o. female patient presents to office with complaint of moderate plantar foot on the left and right. 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  Patient also reports that she has noticed her left third toe turning dark noticed it after her polish was removed.  Patient denies any known trauma or injury to the toe. Denies any other pedal complaints.   Reports that she has been to pain in the past to her PCP and to her heart doctor who stated that her pain was coming because of her poor circulation.  Review of system noncontributory.  Patient Active Problem List   Diagnosis Date Noted  . 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 amLODipine (NORVASC) 5 MG tablet Take 1 tablet (5 mg total) by mouth daily. 90 tablet 3  . aspirin 81 MG tablet Take 81 mg by mouth daily.    . cholecalciferol (VITAMIN D) 1000 units tablet Take 1,000 Units by mouth daily.    . hydrochlorothiazide (HYDRODIURIL) 25 MG tablet Take 12.5 mg by mouth daily.  0  . ipratropium (ATROVENT) 0.03 % nasal spray Place 2 sprays into both nostrils every 12 (twelve) hours. 30 mL 12  . metoprolol succinate (TOPROL-XL) 50 MG 24 hr tablet Take 50 mg by mouth daily.  1  . rosuvastatin (CRESTOR) 10 MG tablet Take 10 mg by mouth at bedtime.    . vitamin B-12 (CYANOCOBALAMIN) 1000 MCG tablet Take 1,000 mcg by mouth daily.    . vitamin C (ASCORBIC ACID) 500 MG tablet Take 500 mg by mouth daily.     No current facility-administered medications on file prior to visit.    Allergies  Allergen Reactions  . Bactrim Ds [Sulfamethoxazole-Trimethoprim] Nausea And Vomiting  . Darvon [Propoxyphene] Nausea And Vomiting  . Docusate Nausea And Vomiting   . Lisinopril     Other reaction(s): Cough  . Other     Darvocet=nausea vomiting  . Zyvox [Linezolid] Nausea And Vomiting    Objective: Physical Exam General: The patient is alert and oriented x3 in no acute distress.  Dermatology: Skin is warm, dry and supple bilateral lower extremities. Nails 1-10 are normal but there is mild discoloration noted at the proximal nailbed of the left third toe appearing as dried blood with no signs of infection. There is no erythema, edema, no eccymosis, no open lesions present.  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  Integument is otherwise unremarkable.  Vascular: Dorsalis Pedis pulse and Posterior Tibial pulse are 1/4 bilateral. Capillary fill time is less than 5 seconds to all digits.  Neurological: Grossly intact to light touch bilateral.  Musculoskeletal: Tenderness to palpation at the medial calcaneal tubercale and through the insertion of the plantar fascia on the left and right foot that extends into the arch. No pain with compression of calcaneus bilateral. No pain with tuning fork to calcaneus bilateral. No pain with calf compression bilateral. There is decreased Ankle joint range of motion bilateral. All other joints range of motion within normal limits bilateral. Strength 5/5 in all groups bilateral.   Gait: Unassisted, Antalgic  Xray, Right/Left foot:  Normal osseous mineralization. Joint spaces preserved. No fracture/dislocation/boney destruction. Calcaneal spur present with mild thickening of plantar fascia. No  other soft tissue abnormalities or radiopaque foreign bodies.   Assessment and Plan: Problem List Items Addressed This Visit   None   Visit Diagnoses    Pain    -  Primary   Relevant Orders   DG Foot Complete Left (Completed)   DG Foot Complete Right (Completed)   Bilateral plantar fasciitis       Foot pain, bilateral       Nail discoloration          -Complete examination performed.  -Xrays  reviewed -Discussed with patient in detail the condition of plantar fasciitis, how this occurs and general treatment options. Explained both conservative and surgical treatments.  -No injection at this time due to diffuse pain -Rx steroid/prednisone Dosepak for patient take as instructed -Recommended good supportive shoes and advised use of OTC insert.   -Dispensed heel lifts for patient to use as needed especially in her bedroom shoes. -Explained and dispensed to patient daily stretching exercises. -Recommend patient to ice affected area 1-2x daily. -Patient to return to office if fails to continue to improve or sooner if problems or questions arise.  Asencion Islam, DPM

## 2020-03-22 ENCOUNTER — Other Ambulatory Visit: Payer: Self-pay

## 2020-03-22 ENCOUNTER — Other Ambulatory Visit (HOSPITAL_COMMUNITY): Payer: Self-pay | Admitting: Cardiology

## 2020-03-22 ENCOUNTER — Ambulatory Visit (HOSPITAL_COMMUNITY)
Admission: RE | Admit: 2020-03-22 | Discharge: 2020-03-22 | Disposition: A | Discharge status: Home / Self Care | Payer: Medicare Other | Source: Ambulatory Visit | Attending: Cardiology | Admitting: Cardiology

## 2020-03-22 DIAGNOSIS — I77811 Abdominal aortic ectasia: Secondary | ICD-10-CM

## 2020-03-22 DIAGNOSIS — I77819 Aortic ectasia, unspecified site: Secondary | ICD-10-CM

## 2020-03-23 ENCOUNTER — Encounter: Payer: Self-pay | Admitting: *Deleted

## 2020-07-06 ENCOUNTER — Ambulatory Visit: Payer: Medicare Other | Admitting: Pulmonary Disease

## 2020-07-06 ENCOUNTER — Other Ambulatory Visit: Payer: Self-pay

## 2020-07-06 ENCOUNTER — Encounter: Payer: Self-pay | Admitting: Sports Medicine

## 2020-07-06 ENCOUNTER — Encounter: Payer: Self-pay | Admitting: Pulmonary Disease

## 2020-07-06 ENCOUNTER — Ambulatory Visit: Payer: Medicare Other | Admitting: Sports Medicine

## 2020-07-06 VITALS — BP 124/80 | HR 82 | Temp 97.3°F | Ht 65.0 in | Wt 208.0 lb

## 2020-07-06 DIAGNOSIS — J3489 Other specified disorders of nose and nasal sinuses: Secondary | ICD-10-CM

## 2020-07-06 DIAGNOSIS — M722 Plantar fascial fibromatosis: Secondary | ICD-10-CM

## 2020-07-06 DIAGNOSIS — L84 Corns and callosities: Secondary | ICD-10-CM | POA: Diagnosis not present

## 2020-07-06 DIAGNOSIS — M79672 Pain in left foot: Secondary | ICD-10-CM

## 2020-07-06 DIAGNOSIS — B351 Tinea unguium: Secondary | ICD-10-CM | POA: Diagnosis not present

## 2020-07-06 DIAGNOSIS — R059 Cough, unspecified: Secondary | ICD-10-CM

## 2020-07-06 DIAGNOSIS — M79671 Pain in right foot: Secondary | ICD-10-CM | POA: Diagnosis not present

## 2020-07-06 DIAGNOSIS — M79674 Pain in right toe(s): Secondary | ICD-10-CM

## 2020-07-06 DIAGNOSIS — M79675 Pain in left toe(s): Secondary | ICD-10-CM

## 2020-07-06 MED ORDER — ALBUTEROL SULFATE HFA 108 (90 BASE) MCG/ACT IN AERS: 2.0000 | INHALATION_SPRAY | Freq: Four times a day (QID) | RESPIRATORY_TRACT | 6 refills | Status: AC | PRN

## 2020-07-06 NOTE — Patient Instructions (Addendum)
Start albuterol inhaler 1-2 puffs every  4-6 hours as needed for cough or shortness of breath.   If you would like we can place a referral to the ENT doctors for further evaluation.

## 2020-07-06 NOTE — Progress Notes (Signed)
Subjective: Rebekah Skinner is a 80 y.o. female returns to office for follow up evaluation of bilateral foot pain. Patient reports her feet hurt on the bottom and nothing has helped. Went to ortho and had shots and previous meds given in December has not helped. Patient denies any recent changes in medications or new problems since last visit. No other issues noted.   Patient Active Problem List   Diagnosis Date Noted  . Degeneration of lumbar intervertebral disc 04/09/2018  . Low back pain 01/20/2018  . 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 colestipol (COLESTID) 1 g tablet colestipol 1 gram tablet    . amLODipine (NORVASC) 5 MG tablet Take 1 tablet (5 mg total) by mouth daily. 90 tablet 3  . amoxicillin (AMOXIL) 500 MG capsule amoxicillin 500 mg capsule  TAKE 2 CAPSULES BY MOUTH 2 HOURS PRIOR TO DENTAL WORK THEN TAKE 2 CAPSULES 2 HOURS AFTER DENTAL WORK    . aspirin 81 MG tablet Take 81 mg by mouth daily.    . cholecalciferol (VITAMIN D) 1000 units tablet Take 1,000 Units by mouth daily.    . dapagliflozin propanediol (FARXIGA) 10 MG TABS tablet Farxiga 10 mg tablet    . diphenoxylate-atropine (LOMOTIL) 2.5-0.025 MG tablet Take 1 tablet by mouth 4 (four) times daily as needed.    . fluticasone (FLONASE) 50 MCG/ACT nasal spray fluticasone propionate 50 mcg/actuation nasal spray,suspension    . gabapentin (NEURONTIN) 300 MG capsule gabapentin 300 mg capsule  TAKE 1 CAPSULE BY MOUTH IN THE MORNING, AND TAKE 2 CAPSULES AT NIGHT    . glipiZIDE (GLUCOTROL) 5 MG tablet glipizide 5 mg tablet    . hydrochlorothiazide (HYDRODIURIL) 25 MG tablet Take 12.5 mg by mouth daily.  0  . ipratropium (ATROVENT) 0.03 % nasal spray Place 2 sprays into both nostrils every 12 (twelve) hours. 30 mL 12  . metoprolol succinate (TOPROL-XL) 50 MG 24 hr tablet Take 50 mg by mouth daily.  1  . ondansetron (ZOFRAN) 4 MG  tablet ondansetron HCl 4 mg tablet    . predniSONE (STERAPRED UNI-PAK 21 TAB) 10 MG (21) TBPK tablet Take as directed 21 tablet 0  . rosuvastatin (CRESTOR) 10 MG tablet Take 10 mg by mouth at bedtime.    . vitamin B-12 (CYANOCOBALAMIN) 1000 MCG tablet Take 1,000 mcg by mouth daily.    . vitamin C (ASCORBIC ACID) 500 MG tablet Take 500 mg by mouth daily.     No current facility-administered medications on file prior to visit.    Allergies  Allergen Reactions  . Bactrim Ds [Sulfamethoxazole-Trimethoprim] Nausea And Vomiting  . Darvon [Propoxyphene] Nausea And Vomiting  . Docusate Nausea And Vomiting  . Lisinopril     Other reaction(s): Cough  . Other     Darvocet=nausea vomiting  . Zyvox [Linezolid] Nausea And Vomiting    Objective:   General:  Alert and oriented x 3, in no acute distress  Dermatology: Skin is warm, dry, and supple bilateral. Nails are thick and elongated x 10. There is no lower extremity erythema, no eccymosis, no open lesions present bilateral. +callus sub met 5 bilateral.  Vascular: Dorsalis Pedis and Posterior Tibial pedal pulses are 1/4 bilateral. + hair growth noted bilateral. Capillary Fill Time is 3 seconds in all digits. No varicosities, No edema bilateral lower extremities.   Neurological: Sensation grossly intact to light touch.  Musculoskeletal: There is tenderness to  palpation at the medial calcaneal tubercale and through the insertion of the plantar fascia on the Left=right foot that extends to arch and forefoot. + taliors bunion and fat pad atrophy bilateral. Strength 5/5 bilateral.   Assessment and Plan: Problem List Items Addressed This Visit   None   Visit Diagnoses    Bilateral plantar fasciitis    -  Primary   Foot pain, bilateral       Pain due to onychomycosis of toenails of both feet       Callus         -Complete examination performed.  -Previous x-rays reviewed. -Discussed with patient in detail the condition of plantar fasciitis,  how this  occurs related to the foot type of the patient and general treatment options. -Patient had injections by orthopedic that did not help -Patient does not want to be put back on oral meds because didn't help from before -Dispensed night splint -Continue with stretching, icing, good supportive shoes, inserts daily.  -Discussed long term care and reocurrence; will closely monitor; if fails to improve will consider other treatment modalities. Advised patient if no improvement may benefit from PT -At no charge debrided nails x 10 and callus sub met 5 bilateral -Patient to return to office if fails to improve after 1 month or sooner if problems or questions arise.  Landis Martins, DPM

## 2020-07-06 NOTE — Progress Notes (Signed)
Synopsis: Referred by Annie Sable, MD  for chronic cough  Subjective:   PATIENT ID: Rebekah Skinner GENDER: female DOB: Jul 13, 1940, MRN: [id number]  HPI  Chief Complaint  Patient presents with  . Follow-up    No complaints   Rebekah Skinner is a 80 year old woman, former smoker with coronary artery disease and hypertension who returns to pulmonary clinic for chronic cough.   She was tried on futicasone nasal spray, ipratropium nasal spray and daily allegra for 30 days without improvement in her nasal/sinus drainage and cough.  She continues to have clear sputum production on a frequent basis throughout the day. She denies wheezing. She does report some shortness of breath. She has not tried any inhalers for her cough in the past. She has not seen an ENT for her cough. She denies overt heartburn symptoms.   She reports having covid in 02/2019 and was sick for 3 weeks. She did not require hospitalization. She reports having the cough prior to covid.    Past Medical History:  Diagnosis Date  . CAD (coronary artery disease)   . Hyperlipidemia   . Hypertension   . Myocardial infarction [facility]) 2002     Family History  Problem Relation Age of Onset  . CAD Mother   . CAD Brother      Social History   Socioeconomic History  . Marital status: Widowed    Spouse name: Not on file  . Number of children: 2  . Years of education: Not on file  . Highest education level: Not on file  Occupational History  . Not on file  Tobacco Use  . Smoking status: Former Smoker    Packs/day: 0.50    Years: 20.00    Pack years: 10.00    Types: Cigarettes    Quit date: 2000    Years since quitting: 22.3  . Smokeless tobacco: Never Used  Vaping Use  . Vaping Use: Never used  Substance and Sexual Activity  . Alcohol use: No  . Drug use: No  . Sexual activity: Not on file  Other Topics Concern  . Not on file  Social History Narrative  . Not on file   Social Determinants of Health    Financial Resource Strain: Not on file  Food Insecurity: Not on file  Transportation Needs: Not on file  Physical Activity: Not on file  Stress: Not on file  Social Connections: Not on file  Intimate Partner Violence: Not on file     Allergies  Allergen Reactions  . Bactrim Ds [Sulfamethoxazole-Trimethoprim] Nausea And Vomiting  . Darvon [Propoxyphene] Nausea And Vomiting  . Docusate Nausea And Vomiting  . Lisinopril     Other reaction(s): Cough  . Other     Darvocet=nausea vomiting  . Zyvox [Linezolid] Nausea And Vomiting     Outpatient Medications Prior to Visit  Medication Sig Dispense Refill  . amoxicillin (AMOXIL) 500 MG capsule amoxicillin 500 mg capsule  TAKE 2 CAPSULES BY MOUTH 2 HOURS PRIOR TO DENTAL WORK THEN TAKE 2 CAPSULES 2 HOURS AFTER DENTAL WORK    . aspirin 81 MG tablet Take 81 mg by mouth daily.    . cholecalciferol (VITAMIN D) 1000 units tablet Take 1,000 Units by mouth daily.    . colestipol (COLESTID) 1 g tablet colestipol 1 gram tablet    . dapagliflozin propanediol (FARXIGA) 10 MG TABS tablet Farxiga 10 mg tablet    . diphenoxylate-atropine (LOMOTIL) 2.5-0.025 MG tablet Take 1 tablet by mouth 4 (  four) times daily as needed.    . gabapentin (NEURONTIN) 300 MG capsule gabapentin 300 mg capsule  TAKE 1 CAPSULE BY MOUTH IN THE MORNING, AND TAKE 2 CAPSULES AT NIGHT    . glipiZIDE (GLUCOTROL) 5 MG tablet glipizide 5 mg tablet    . hydrochlorothiazide (HYDRODIURIL) 25 MG tablet Take 12.5 mg by mouth daily.  0  . metoprolol succinate (TOPROL-XL) 50 MG 24 hr tablet Take 50 mg by mouth daily.  1  . ondansetron (ZOFRAN) 4 MG tablet ondansetron HCl 4 mg tablet    . predniSONE (STERAPRED UNI-PAK 21 TAB) 10 MG (21) TBPK tablet Take as directed 21 tablet 0  . rosuvastatin (CRESTOR) 10 MG tablet Take 10 mg by mouth at bedtime.    . vitamin B-12 (CYANOCOBALAMIN) 1000 MCG tablet Take 1,000 mcg by mouth daily.    . vitamin C (ASCORBIC ACID) 500 MG tablet Take 500 mg by  mouth daily.    Marland Kitchen amLODipine (NORVASC) 5 MG tablet Take 1 tablet (5 mg total) by mouth daily. 90 tablet 3  . ipratropium (ATROVENT) 0.03 % nasal spray Place 2 sprays into both nostrils every 12 (twelve) hours. (Patient not taking: Reported on 07/06/2020) 30 mL 12  . fluticasone (FLONASE) 50 MCG/ACT nasal spray fluticasone propionate 50 mcg/actuation nasal spray,suspension (Patient not taking: Reported on 07/06/2020)     No facility-administered medications prior to visit.    Review of Systems  Constitutional: Negative for chills, diaphoresis, fever, malaise/fatigue and weight loss.  HENT: Negative for congestion, sinus pain and sore throat.   Eyes: Negative for blurred vision.  Respiratory: Positive for cough and sputum production. Negative for hemoptysis, shortness of breath and wheezing.   Cardiovascular: Negative for chest pain, palpitations, orthopnea, claudication, leg swelling and PND.  Gastrointestinal: Negative for abdominal pain, blood in stool, constipation, diarrhea, heartburn, nausea and vomiting.  Genitourinary: Negative for dysuria and hematuria.  Musculoskeletal: Negative for joint pain and myalgias.  Skin: Negative for itching and rash.  Neurological: Negative for dizziness, weakness and headaches.  Endo/Heme/Allergies: Does not bruise/bleed easily.  Psychiatric/Behavioral: Negative.     Objective:   Vitals:   07/06/20 1354  BP: 124/80  Pulse: 82  Temp: (!) 97.3 F (36.3 C)  SpO2: 96%  Weight: 208 lb (94.3 kg)  Height: 5\' 5"  (1.651 m)     Physical Exam Constitutional:      General: She is not in acute distress.    Appearance: She is obese. She is not ill-appearing.  HENT:     Head: Normocephalic and atraumatic.     Nose: Nose normal.     Mouth/Throat:     Mouth: Mucous membranes are moist.  Eyes:     General: No scleral icterus.    Conjunctiva/sclera: Conjunctivae normal.     Pupils: Pupils are equal, round, and reactive to light.  Cardiovascular:      Rate and Rhythm: Normal rate and regular rhythm.     Pulses: Normal pulses.     Heart sounds: Normal heart sounds. No murmur heard.   Pulmonary:     Effort: Pulmonary effort is normal.     Breath sounds: Normal breath sounds. No wheezing, rhonchi or rales.  Abdominal:     General: Bowel sounds are normal.     Palpations: Abdomen is soft.  Musculoskeletal:     Cervical back: Neck supple.     Right lower leg: No edema.     Left lower leg: No edema.  Lymphadenopathy:  Cervical: No cervical adenopathy.  Skin:    Findings: No lesion or rash.  Neurological:     General: No focal deficit present.     Mental Status: She is alert and oriented to person, place, and time. Mental status is at baseline.  Psychiatric:        Mood and Affect: Mood normal.        Behavior: Behavior normal.        Thought Content: Thought content normal.        Judgment: Judgment normal.     CBC    Component Value Date/Time   WBC 10.9 (H) 04/13/2015 0426   RBC 2.96 (L) 04/13/2015 0426   HGB 9.0 (L) 04/13/2015 0426   HCT 29.7 (L) 04/13/2015 0426   PLT 202 04/13/2015 0426   MCV 100.3 (H) 04/13/2015 0426   MCH 30.4 04/13/2015 0426   MCHC 30.3 04/13/2015 0426   RDW 14.9 04/13/2015 0426   LYMPHSABS 3.0 04/07/2015 1340   MONOABS 0.8 04/07/2015 1340   EOSABS 0.1 04/07/2015 1340   BASOSABS 0.0 04/07/2015 1340   BMP Latest Ref Rng & Units 04/13/2015 04/12/2015 04/07/2015  Glucose 65 - 99 mg/dL 578(I130(H) 696(E135(H) 88  BUN 6 - 20 mg/dL 95(M26(H) 19 84(X29(H)  Creatinine 0.44 - 1.00 mg/dL 3.24(M1.12(H) 0.10(U1.12(H) 7.25(D1.25(H)  Sodium 135 - 145 mmol/L 140 136 137  Potassium 3.5 - 5.1 mmol/L 4.9 4.7 5.1  Chloride 101 - 111 mmol/L 108 106 104  CO2 22 - 32 mmol/L 26 22 22   Calcium 8.9 - 10.3 mg/dL 8.0(L) 8.0(L) 9.0    Chest imaging: CXR 12/28/19 Upper normal heart size.  Atherosclerotic calcifications of a tortuous thoracic aorta. Mediastinal contours and pulmonary vascularity otherwise normal. Lungs clear. No pulmonary  infiltrate, pleural effusion or pneumothorax. Osseous structures unremarkable.  PFT: None on file  Echo: 05/10/2019 1. Left ventricular ejection fraction, by estimation, is 50 to 55%. The  left ventricle has low normal function. The left ventricle has no regional  wall motion abnormalities. There is mild left ventricular hypertrophy.  Left ventricular diastolic  parameters are consistent with Grade I diastolic dysfunction (impaired  relaxation). The average left ventricular global longitudinal strain is  -19.3 %.  2. Right ventricular systolic function is mildly reduced. The right  ventricular size is normal. Tricuspid regurgitation signal is inadequate  for assessing PA pressure.  3. The mitral valve is normal in structure and function. No evidence of  mitral valve regurgitation.  4. The aortic valve was not well visualized. Aortic valve regurgitation  is not visualized. No aortic stenosis is present.  5. Aortic dilatation noted. 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  6. The inferior vena cava is normal in size with greater than 50%  respiratory variability, suggesting right atrial pressure of 3 mmHg.  Heart Catheterization:   Assessment & Plan:   Cough - Plan: albuterol (VENTOLIN HFA) 108 (90 Base) MCG/ACT inhaler  Sinus drainage  Discussion: Rebekah BurnsVivian Wurtz is a 80 year old woman, former smoker with coronary artery disease and hypertension who returns to pulmonary clinic for chronic cough.   She was treated for upper airway cough syndrome at last visit with flonase, ipratriopium nasal spray and allegra daily without improvement.   She is not open to a trial of PPI therapy for treatment of silent reflux disease.   She does not wish to be referred to ENT at this time.   She is open to a trial of albuterol inhaler  therapy. We have sent this to her pharmacy.   Her chest radiograph from 2021 is unremarkable. There is low concern for  bronchiectasis at this time.  She is to follow up in 3 months.  Melody Comas, MD [facility] Office: [phone]   Current Outpatient Medications:  .  amoxicillin (AMOXIL) 500 MG capsule, amoxicillin 500 mg capsule  TAKE 2 CAPSULES BY MOUTH 2 HOURS PRIOR TO DENTAL WORK THEN TAKE 2 CAPSULES 2 HOURS AFTER DENTAL WORK, Disp: , Rfl:  .  aspirin 81 MG tablet, Take 81 mg by mouth daily., Disp: , Rfl:  .  cholecalciferol (VITAMIN D) 1000 units tablet, Take 1,000 Units by mouth daily., Disp: , Rfl:  .  colestipol (COLESTID) 1 g tablet, colestipol 1 gram tablet, Disp: , Rfl:  .  dapagliflozin propanediol (FARXIGA) 10 MG TABS tablet, Farxiga 10 mg tablet, Disp: , Rfl:  .  diphenoxylate-atropine (LOMOTIL) 2.5-0.025 MG tablet, Take 1 tablet by mouth 4 (four) times daily as needed., Disp: , Rfl:  .  gabapentin (NEURONTIN) 300 MG capsule, gabapentin 300 mg capsule  TAKE 1 CAPSULE BY MOUTH IN THE MORNING, AND TAKE 2 CAPSULES AT NIGHT, Disp: , Rfl:  .  glipiZIDE (GLUCOTROL) 5 MG tablet, glipizide 5 mg tablet, Disp: , Rfl:  .  hydrochlorothiazide (HYDRODIURIL) 25 MG tablet, Take 12.5 mg by mouth daily., Disp: , Rfl: 0 .  metoprolol succinate (TOPROL-XL) 50 MG 24 hr tablet, Take 50 mg by mouth daily., Disp: , Rfl: 1 .  ondansetron (ZOFRAN) 4 MG tablet, ondansetron HCl 4 mg tablet, Disp: , Rfl:  .  predniSONE (STERAPRED UNI-PAK 21 TAB) 10 MG (21) TBPK tablet, Take as directed, Disp: 21 tablet, Rfl: 0 .  rosuvastatin (CRESTOR) 10 MG tablet, Take 10 mg by mouth at bedtime., Disp: , Rfl:  .  vitamin B-12 (CYANOCOBALAMIN) 1000 MCG tablet, Take 1,000 mcg by mouth daily., Disp: , Rfl:  .  vitamin C (ASCORBIC ACID) 500 MG tablet, Take 500 mg by mouth daily., Disp: , Rfl:  .  albuterol (VENTOLIN HFA) 108 (90 Base) MCG/ACT inhaler, Inhale 2 puffs into the lungs every 6 (six) hours as needed for wheezing or shortness of breath., Disp: 8 g, Rfl: 6 .  amLODipine (NORVASC) 5 MG tablet, Take 1  tablet (5 mg total) by mouth daily., Disp: 90 tablet, Rfl: 3 .  ipratropium (ATROVENT) 0.03 % nasal spray, Place 2 sprays into both nostrils every 12 (twelve) hours. (Patient not taking: Reported on 07/06/2020), Disp: 30 mL, Rfl: 12

## 2020-07-07 ENCOUNTER — Encounter: Payer: Self-pay | Admitting: Pulmonary Disease

## 2020-08-17 ENCOUNTER — Telehealth: Payer: Self-pay | Admitting: Sports Medicine

## 2020-08-17 NOTE — Telephone Encounter (Signed)
Patient would like [facility].

## 2020-12-22 ENCOUNTER — Other Ambulatory Visit (HOSPITAL_BASED_OUTPATIENT_CLINIC_OR_DEPARTMENT_OTHER): Payer: Self-pay | Admitting: Cardiology

## 2020-12-22 DIAGNOSIS — I77819 Aortic ectasia, unspecified site: Secondary | ICD-10-CM

## 2021-02-12 NOTE — Progress Notes (Deleted)
HPI: FU CAD. Previously followed in [place]. 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 Catheterization revealed ejection fraction 40-45% with inferior akinesis. There was a 50% LAD. There was a 99% focal RCA at right ventricular branch. There was a 50% PDA. Patient had PCI of RCA.She has a history of peripheral vascular disease and ruptured abdominal aortic aneurysm. Nuclear study September 2016 showed ejection fraction 41% with mild inferior hypokinesis. No ischemia. Echocardiogram March 2021 showed normal LV function, mild left ventricular hypertrophy, grade 1 diastolic dysfunction, mild RV dysfunction.  Abdominal ultrasound January 2022 showed 3.2 cm abdominal aortic aneurysm status postrepair in 2007.  Since last seen,    Current Outpatient Medications  Medication Sig Dispense Refill   albuterol (VENTOLIN HFA) 108 (90 Base) MCG/ACT inhaler Inhale 2 puffs into the lungs every 6 (six) hours as needed for wheezing or shortness of breath. 8 g 6   amLODipine (NORVASC) 5 MG tablet Take 1 tablet (5 mg total) by mouth daily. 90 tablet 3   amoxicillin (AMOXIL) 500 MG capsule amoxicillin 500 mg capsule  TAKE 2 CAPSULES BY MOUTH 2 HOURS PRIOR TO DENTAL WORK THEN TAKE 2 CAPSULES 2 HOURS AFTER DENTAL WORK     aspirin 81 MG tablet Take 81 mg by mouth daily.     cholecalciferol (VITAMIN D) 1000 units tablet Take 1,000 Units by mouth daily.     colestipol (COLESTID) 1 g tablet colestipol 1 gram tablet     dapagliflozin propanediol (FARXIGA) 10 MG TABS tablet Farxiga 10 mg tablet     diphenoxylate-atropine (LOMOTIL) 2.5-0.025 MG tablet Take 1 tablet by mouth 4 (four) times daily as needed.     gabapentin (NEURONTIN) 300 MG capsule gabapentin 300 mg capsule  TAKE 1 CAPSULE BY MOUTH IN THE MORNING, AND TAKE 2 CAPSULES AT NIGHT     glipiZIDE (GLUCOTROL) 5 MG tablet glipizide 5 mg tablet     hydrochlorothiazide (HYDRODIURIL) 25 MG tablet Take 12.5 mg by mouth daily.  0   ipratropium  (ATROVENT) 0.03 % nasal spray Place 2 sprays into both nostrils every 12 (twelve) hours. (Patient not taking: Reported on 07/06/2020) 30 mL 12   metoprolol succinate (TOPROL-XL) 50 MG 24 hr tablet Take 50 mg by mouth daily.  1   ondansetron (ZOFRAN) 4 MG tablet ondansetron HCl 4 mg tablet     predniSONE (STERAPRED UNI-PAK 21 TAB) 10 MG (21) TBPK tablet Take as directed 21 tablet 0   rosuvastatin (CRESTOR) 10 MG tablet Take 10 mg by mouth at bedtime.     vitamin B-12 (CYANOCOBALAMIN) 1000 MCG tablet Take 1,000 mcg by mouth daily.     vitamin C (ASCORBIC ACID) 500 MG tablet Take 500 mg by mouth daily.     No current facility-administered medications for this visit.     Past Medical History:  Diagnosis Date   CAD (coronary artery disease)    Hyperlipidemia    Hypertension    Myocardial infarction [facility]) 2002    Past Surgical History:  Procedure Laterality Date   ABDOMINAL HYSTERECTOMY  1 ovary removed   APPENDECTOMY  8th grade   CHOLECYSTECTOMY  april 2013   EYE SURGERY Left 05-25-2013   cataract lens replacement   other surgery  2007   12 other surgeries due to ruptued aneurysm   right knee meniscus repair  sept 30, 2016   stent to heart      right rca 7024-2002   surgery for ruptured abdominal aortic aneurysm  2007   TONSILLECTOMY  7th grade   TOTAL KNEE ARTHROPLASTY Right 04/11/2015   Procedure: TOTAL RIGHT KNEE ARTHROPLASTY;  Surgeon: Ranee Gosselin, MD;  Location: [facility];  Service: Orthopedics;  Laterality: Right;   TUBAL LIGATION  1971    Social History   Socioeconomic History   Marital status: Widowed    Spouse name: Not on file   Number of children: 2   Years of education: Not on file   Highest education level: Not on file  Occupational History   Not on file  Tobacco Use   Smoking status: Former    Packs/day: 0.50    Years: 20.00    Pack years: 10.00    Types: Cigarettes    Quit date: 2000    Years since quitting: 22.9   Smokeless tobacco: Never  Vaping Use    Vaping Use: Never used  Substance and Sexual Activity   Alcohol use: No   Drug use: No   Sexual activity: Not on file  Other Topics Concern   Not on file  Social History Narrative   Not on file   Social Determinants of Health   Financial Resource Strain: Not on file  Food Insecurity: Not on file  Transportation Needs: Not on file  Physical Activity: Not on file  Stress: Not on file  Social Connections: Not on file  Intimate Partner Violence: Not on file    Family History  Problem Relation Age of Onset   CAD Mother    CAD Brother     ROS: no fevers or chills, productive cough, hemoptysis, dysphasia, odynophagia, melena, hematochezia, dysuria, hematuria, rash, seizure activity, orthopnea, PND, pedal edema, claudication. Remaining systems are negative.  Physical Exam: Well-developed well-nourished in no acute distress.  Skin is warm and dry.  HEENT is normal.  Neck is supple.  Chest is clear to auscultation with normal expansion.  Cardiovascular exam is regular rate and rhythm.  Abdominal exam nontender or distended. No masses palpated. Extremities show no edema. neuro grossly intact  ECG- personally reviewed  A/P  1 coronary artery disease-patient doing well with no chest pain.  Continue medical therapy with aspirin and statin.  2 hypertension-patient's blood pressure is controlled.  Continue present medications.  3 hyperlipidemia-continue statin at present dose.  Note she did not tolerate other statins previously and is not interested in increasing dose of Crestor.  4 abdominal aortic aneurysm-plan follow-up ultrasound January 2023.  Olga Millers, MD

## 2021-02-14 ENCOUNTER — Ambulatory Visit: Payer: Medicare Other | Admitting: Cardiology

## 2021-02-19 ENCOUNTER — Ambulatory Visit: Payer: Medicare Other | Admitting: Cardiology

## 2021-02-19 ENCOUNTER — Encounter: Payer: Self-pay | Admitting: Cardiology

## 2021-02-19 ENCOUNTER — Other Ambulatory Visit: Payer: Self-pay

## 2021-02-19 VITALS — BP 120/72 | HR 87 | Ht 65.0 in | Wt 209.2 lb

## 2021-02-19 DIAGNOSIS — R0989 Other specified symptoms and signs involving the circulatory and respiratory systems: Secondary | ICD-10-CM

## 2021-02-19 DIAGNOSIS — E7800 Pure hypercholesterolemia, unspecified: Secondary | ICD-10-CM

## 2021-02-19 DIAGNOSIS — I714 Abdominal aortic aneurysm, without rupture, unspecified: Secondary | ICD-10-CM

## 2021-02-19 DIAGNOSIS — I2510 Atherosclerotic heart disease of native coronary artery without angina pectoris: Secondary | ICD-10-CM

## 2021-02-19 DIAGNOSIS — I10 Essential (primary) hypertension: Secondary | ICD-10-CM | POA: Diagnosis not present

## 2021-02-19 NOTE — Progress Notes (Signed)
HPI: FU CAD. Previously followed in [place]. 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 Catheterization revealed ejection fraction 40-45% with inferior akinesis. There was a 50% LAD. There was a 99% focal RCA at right ventricular branch. There was a 50% PDA. Patient had PCI of RCA.She has a history of peripheral vascular disease and ruptured abdominal aortic aneurysm. Nuclear study September 2016 showed ejection fraction 41% with mild inferior hypokinesis. No ischemia. Echocardiogram March 2021 showed normal LV function, mild left ventricular hypertrophy, grade 1 diastolic dysfunction, mild RV dysfunction.  Abdominal ultrasound January 2022 showed ectatic aorta measuring 3.2 cm status post abdominal aortic aneurysm repair.  Since last seen, she denies dyspnea, chest pain, palpitations or syncope.  Current Outpatient Medications  Medication Sig Dispense Refill   albuterol (VENTOLIN HFA) 108 (90 Base) MCG/ACT inhaler Inhale 2 puffs into the lungs every 6 (six) hours as needed for wheezing or shortness of breath. 8 g 6   amLODipine (NORVASC) 5 MG tablet Take 1 tablet (5 mg total) by mouth daily. 90 tablet 3   amoxicillin (AMOXIL) 500 MG capsule amoxicillin 500 mg capsule  TAKE 2 CAPSULES BY MOUTH 2 HOURS PRIOR TO DENTAL WORK THEN TAKE 2 CAPSULES 2 HOURS AFTER DENTAL WORK     aspirin 81 MG tablet Take 81 mg by mouth daily.     cholecalciferol (VITAMIN D) 1000 units tablet Take 1,000 Units by mouth daily.     colestipol (COLESTID) 1 g tablet colestipol 1 gram tablet     dapagliflozin propanediol (FARXIGA) 10 MG TABS tablet Farxiga 10 mg tablet     diphenoxylate-atropine (LOMOTIL) 2.5-0.025 MG tablet Take 1 tablet by mouth 4 (four) times daily as needed.     gabapentin (NEURONTIN) 300 MG capsule gabapentin 300 mg capsule  TAKE 1 CAPSULE BY MOUTH IN THE MORNING, AND TAKE 2 CAPSULES AT NIGHT     glipiZIDE (GLUCOTROL) 5 MG tablet glipizide 5 mg tablet     hydrochlorothiazide  (HYDRODIURIL) 25 MG tablet Take 12.5 mg by mouth daily.  0   ipratropium (ATROVENT) 0.03 % nasal spray Place 2 sprays into both nostrils every 12 (twelve) hours. 30 mL 12   metoprolol succinate (TOPROL-XL) 50 MG 24 hr tablet Take 50 mg by mouth daily.  1   rosuvastatin (CRESTOR) 10 MG tablet Take 10 mg by mouth at bedtime.     vitamin B-12 (CYANOCOBALAMIN) 1000 MCG tablet Take 1,000 mcg by mouth daily.     vitamin C (ASCORBIC ACID) 500 MG tablet Take 500 mg by mouth daily.     No current facility-administered medications for this visit.     Past Medical History:  Diagnosis Date   CAD (coronary artery disease)    Hyperlipidemia    Hypertension    Myocardial infarction ([facility]) 2002    Past Surgical History:  Procedure Laterality Date   ABDOMINAL HYSTERECTOMY  1 ovary removed   APPENDECTOMY  8th grade   CHOLECYSTECTOMY  april 2013   EYE SURGERY Left 05-25-2013   cataract lens replacement   other surgery  2007   12 other surgeries due to ruptued aneurysm   right knee meniscus repair  sept 30, 2016   stent to heart      right rca 7024-2002   surgery for ruptured abdominal aortic aneurysm  2007   TONSILLECTOMY  7th grade   TOTAL KNEE ARTHROPLASTY Right 04/11/2015   Procedure: TOTAL RIGHT KNEE ARTHROPLASTY;  Surgeon: Ranee Gosselin, MD;  Location: [facility];  Service: Orthopedics;  Laterality: Right;   TUBAL LIGATION  1971    Social History   Socioeconomic History   Marital status: Widowed    Spouse name: Not on file   Number of children: 2   Years of education: Not on file   Highest education level: Not on file  Occupational History   Not on file  Tobacco Use   Smoking status: Former    Packs/day: 0.50    Years: 20.00    Pack years: 10.00    Types: Cigarettes    Quit date: 2000    Years since quitting: 22.9   Smokeless tobacco: Never  Vaping Use   Vaping Use: Never used  Substance and Sexual Activity   Alcohol use: No   Drug use: No   Sexual activity: Not on file   Other Topics Concern   Not on file  Social History Narrative   Not on file   Social Determinants of Health   Financial Resource Strain: Not on file  Food Insecurity: Not on file  Transportation Needs: Not on file  Physical Activity: Not on file  Stress: Not on file  Social Connections: Not on file  Intimate Partner Violence: Not on file    Family History  Problem Relation Age of Onset   CAD Mother    CAD Brother     ROS: Bilateral foot pain from plantar fasciitis but no fevers or chills, productive cough, hemoptysis, dysphasia, odynophagia, melena, hematochezia, dysuria, hematuria, rash, seizure activity, orthopnea, PND, pedal edema, claudication. Remaining systems are negative.  Physical Exam: Well-developed well-nourished in no acute distress.  Skin is warm and dry.  HEENT is normal.  Neck is supple.  Positive bruit Chest is clear to auscultation with normal expansion.  Cardiovascular exam is regular rate and rhythm.  Abdominal exam nontender or distended. No masses palpated. Extremities show no edema. neuro grossly intact  ECG-normal sinus rhythm at a rate of 76, no ST changes.  Personally reviewed  A/P  1 coronary artery disease-patient doing well with no chest pain.  Continue medical therapy with aspirin and statin.  2 hypertension-patient's blood pressure is controlled.  Continue present medical regimen.  3 hyperlipidemia-continue Crestor at present dose.  She did not tolerate other statins or higher doses of Crestor in the past.  4 abdominal aortic aneurysm-plan follow-up ultrasound January 2023.  5 carotid bruit-schedule ultrasound to exclude significant carotid disease.  Kirk Ruths, MD

## 2021-02-19 NOTE — Patient Instructions (Signed)

## 2021-03-22 ENCOUNTER — Ambulatory Visit (HOSPITAL_COMMUNITY)
Admission: RE | Admit: 2021-03-22 | Discharge: 2021-03-22 | Disposition: A | Discharge status: Home / Self Care | Payer: Medicare Other | Source: Ambulatory Visit | Attending: Cardiology | Admitting: Cardiology

## 2021-03-22 ENCOUNTER — Other Ambulatory Visit: Payer: Self-pay

## 2021-03-22 ENCOUNTER — Ambulatory Visit (HOSPITAL_BASED_OUTPATIENT_CLINIC_OR_DEPARTMENT_OTHER)
Admission: RE | Admit: 2021-03-22 | Discharge: 2021-03-22 | Disposition: A | Discharge status: Home / Self Care | Payer: Medicare Other | Source: Ambulatory Visit | Attending: Cardiology | Admitting: Cardiology

## 2021-03-22 DIAGNOSIS — R0989 Other specified symptoms and signs involving the circulatory and respiratory systems: Secondary | ICD-10-CM

## 2021-03-22 DIAGNOSIS — I77819 Aortic ectasia, unspecified site: Secondary | ICD-10-CM | POA: Insufficient documentation

## 2021-03-22 DIAGNOSIS — I77811 Abdominal aortic ectasia: Secondary | ICD-10-CM

## 2021-03-26 ENCOUNTER — Encounter: Payer: Self-pay | Admitting: *Deleted

## 2022-05-15 NOTE — Progress Notes (Signed)
HPI: FU CAD. Previously followed in [place]. 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 Catheterization revealed ejection fraction 40-45% with inferior akinesis. There was a 50% LAD. There was a 99% focal RCA at right ventricular branch. There was a 50% PDA. Patient had PCI of RCA.She has a history of peripheral vascular disease and ruptured abdominal aortic aneurysm. Nuclear study September 2016 showed ejection fraction 41% with mild inferior hypokinesis. No ischemia. Echocardiogram June 2023 at [facility] showed normal LV function.  Carotid Dopplers January 2023 showed 1 to 39% bilateral stenosis.  Abdominal CT at [facility] September 2023 showed aneurysmal dilatation of the infrarenal abdominal aorta at 3.5 cm.  Patient seen by vascular surgery at [facility] and no plans for further imaging felt to be indicated.  Since last seen, the patient has dyspnea with more extreme activities but not with routine activities. It is relieved with rest. It is not associated with chest pain. There is no orthopnea, PND or pedal edema. There is no syncope or palpitations. There is no exertional chest pain.   Current Outpatient Medications  Medication Sig Dispense Refill   albuterol (VENTOLIN HFA) 108 (90 Base) MCG/ACT inhaler Inhale 2 puffs into the lungs every 6 (six) hours as needed for wheezing or shortness of breath. 8 g 6   amoxicillin (AMOXIL) 500 MG capsule amoxicillin 500 mg capsule  TAKE 2 CAPSULES BY MOUTH 2 HOURS PRIOR TO DENTAL WORK THEN TAKE 2 CAPSULES 2 HOURS AFTER DENTAL WORK     aspirin 81 MG tablet Take 81 mg by mouth daily.     cholecalciferol (VITAMIN D) 1000 units tablet Take 1,000 Units by mouth daily.     dapagliflozin propanediol (FARXIGA) 10 MG TABS tablet Farxiga 10 mg tablet     gabapentin (NEURONTIN) 300 MG capsule gabapentin 300 mg capsule  TAKE 1 CAPSULE BY MOUTH IN THE MORNING, AND TAKE 2 CAPSULES AT NIGHT     glipiZIDE (GLUCOTROL) 5 MG tablet glipizide 5 mg tablet      hydrochlorothiazide (HYDRODIURIL) 25 MG tablet Take 12.5 mg by mouth daily.  0   ipratropium (ATROVENT) 0.03 % nasal spray Place 2 sprays into both nostrils every 12 (twelve) hours. 30 mL 12   metoprolol succinate (TOPROL-XL) 50 MG 24 hr tablet Take 50 mg by mouth daily.  1   rosuvastatin (CRESTOR) 10 MG tablet Take 10 mg by mouth at bedtime.     vitamin B-12 (CYANOCOBALAMIN) 1000 MCG tablet Take 1,000 mcg by mouth daily.     vitamin C (ASCORBIC ACID) 500 MG tablet Take 500 mg by mouth daily.     amLODipine (NORVASC) 5 MG tablet Take 1 tablet (5 mg total) by mouth daily. 90 tablet 3   colestipol (COLESTID) 1 g tablet colestipol 1 gram tablet (Patient not taking: Reported on 05/22/2022)     diphenoxylate-atropine (LOMOTIL) 2.5-0.025 MG tablet Take 1 tablet by mouth 4 (four) times daily as needed. (Patient not taking: Reported on 05/22/2022)     No current facility-administered medications for this visit.     Past Medical History:  Diagnosis Date   CAD (coronary artery disease)    Hyperlipidemia    Hypertension    Myocardial infarction [facility]) 2002    Past Surgical History:  Procedure Laterality Date   ABDOMINAL HYSTERECTOMY  1 ovary removed   APPENDECTOMY  8th grade   CHOLECYSTECTOMY  april 2013   EYE SURGERY Left 05-25-2013   cataract lens replacement   other surgery  2007  12 other surgeries due to ruptued aneurysm   right knee meniscus repair  sept 30, 2016   stent to heart      right rca 7024-2002   surgery for ruptured abdominal aortic aneurysm  2007   TONSILLECTOMY  7th grade   TOTAL KNEE ARTHROPLASTY Right 04/11/2015   Procedure: TOTAL RIGHT KNEE ARTHROPLASTY;  Surgeon: Latanya Maudlin, MD;  Location: [facility];  Service: Orthopedics;  Laterality: Right;   TUBAL LIGATION  1971    Social History   Socioeconomic History   Marital status: Widowed    Spouse name: Not on file   Number of children: 2   Years of education: Not on file   Highest education level: Not on file   Occupational History   Not on file  Tobacco Use   Smoking status: Former    Packs/day: 0.50    Years: 20.00    Total pack years: 10.00    Types: Cigarettes    Quit date: 2000    Years since quitting: 24.2   Smokeless tobacco: Never  Vaping Use   Vaping Use: Never used  Substance and Sexual Activity   Alcohol use: No   Drug use: No   Sexual activity: Not on file  Other Topics Concern   Not on file  Social History Narrative   Not on file   Social Determinants of Health   Financial Resource Strain: Not on file  Food Insecurity: Not on file  Transportation Needs: Not on file  Physical Activity: Not on file  Stress: Not on file  Social Connections: Not on file  Intimate Partner Violence: Not on file    Family History  Problem Relation Age of Onset   CAD Mother    CAD Brother     ROS: no fevers or chills, productive cough, hemoptysis, dysphasia, odynophagia, melena, hematochezia, dysuria, hematuria, rash, seizure activity, orthopnea, PND, pedal edema, claudication. Remaining systems are negative.  Physical Exam: Well-developed well-nourished in no acute distress.  Skin is warm and dry.  HEENT is normal.  Neck is supple.  Chest is clear to auscultation with normal expansion.  Cardiovascular exam is regular rate and rhythm.  Abdominal exam nontender or distended. No masses palpated. Extremities show no edema. neuro grossly intact  ECG-normal sinus rhythm at a rate of 67, first-degree AV block, no ST changes, cannot rule out inferior infarct.  Personally reviewed  A/P  1 coronary artery disease-patient denies chest pain.  Plan to continue medical therapy including aspirin and statin.  2 history of abdominal aortic aneurysm repair-abdominal CT at [facility] September 2023 showed 3.5 cm abdominal aortic aneurysm.  No further imaging felt indicated by vascular surgery.  3 hypertension-blood pressure controlled.  Continue present medications and follow-up.  4  hyperlipidemia-continue Crestor.  Note she did not tolerate high-dose statins in the past.    Kirk Ruths, MD

## 2022-05-22 ENCOUNTER — Encounter: Payer: Self-pay | Admitting: Cardiology

## 2022-05-22 ENCOUNTER — Ambulatory Visit: Payer: Medicare Other | Attending: Cardiology | Admitting: Cardiology

## 2022-05-22 VITALS — BP 126/72 | HR 67 | Ht 65.0 in | Wt 209.0 lb

## 2022-05-22 DIAGNOSIS — I10 Essential (primary) hypertension: Secondary | ICD-10-CM

## 2022-05-22 DIAGNOSIS — I2510 Atherosclerotic heart disease of native coronary artery without angina pectoris: Secondary | ICD-10-CM

## 2022-05-22 DIAGNOSIS — I714 Abdominal aortic aneurysm, without rupture, unspecified: Secondary | ICD-10-CM

## 2022-05-22 DIAGNOSIS — E7800 Pure hypercholesterolemia, unspecified: Secondary | ICD-10-CM | POA: Diagnosis not present

## 2022-05-22 NOTE — Patient Instructions (Signed)
  Follow-Up: At [facility], you and your health needs are our priority.  As part of our continuing mission to provide you with exceptional heart care, we have created designated Provider Care Teams.  These Care Teams include your primary Cardiologist (physician) and Advanced Practice Providers (APPs -  Physician Assistants and Nurse Practitioners) who all work together to provide you with the care you need, when you need it.  We recommend signing up for the patient portal called "MyChart".  Sign up information is provided on this After Visit Summary.  MyChart is used to connect with patients for Virtual Visits (Telemedicine).  Patients are able to view lab/test results, encounter notes, upcoming appointments, etc.  Non-urgent messages can be sent to your provider as well.   To learn more about what you can do with MyChart, go to [email or web address].    Your next appointment:   12 month(s)  Provider:   Brian Crenshaw MD    

## 2023-02-18 ENCOUNTER — Other Ambulatory Visit: Payer: Self-pay | Admitting: *Deleted

## 2023-02-18 DIAGNOSIS — I714 Abdominal aortic aneurysm, without rupture, unspecified: Secondary | ICD-10-CM

## 2023-04-11 ENCOUNTER — Ambulatory Visit (HOSPITAL_COMMUNITY)
Admission: RE | Admit: 2023-04-11 | Payer: Medicare Other | Source: Ambulatory Visit | Attending: Cardiology | Admitting: Cardiology

## 2023-07-18 NOTE — Progress Notes (Signed)
 HPI: FU CAD. Previously followed in [place] . 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 Catheterization revealed ejection fraction 40-45% with inferior akinesis. There was a 50% LAD. There was a 99% focal RCA at right ventricular branch. There was a 50% PDA. Patient had PCI of RCA.She has a history of peripheral vascular disease and ruptured abdominal aortic aneurysm. Nuclear study September 2016 showed ejection fraction 41% with mild inferior hypokinesis. No ischemia. Echocardiogram June 2023 at [facility] showed normal LV function.  Carotid Dopplers January 2023 showed 1 to 39% bilateral stenosis.  Patient had CT at [facility] March 2025 that showed 3.2 cm abdominal aortic aneurysm and postoperative changes along the anterior abdominal wall.  Since last seen, the patient denies any dyspnea on exertion, orthopnea, PND, pedal edema, palpitations, syncope or chest pain.   Current Outpatient Medications  Medication Sig Dispense Refill   albuterol  (VENTOLIN  HFA) 108 (90 Base) MCG/ACT inhaler Inhale 2 puffs into the lungs every 6 (six) hours as needed for wheezing or shortness of breath. 8 g 6   amLODipine  (NORVASC ) 5 MG tablet Take 1 tablet (5 mg total) by mouth daily. 90 tablet 3   amoxicillin (AMOXIL) 500 MG capsule amoxicillin 500 mg capsule  TAKE 2 CAPSULES BY MOUTH 2 HOURS PRIOR TO DENTAL WORK THEN TAKE 2 CAPSULES 2 HOURS AFTER DENTAL WORK     aspirin  81 MG tablet Take 81 mg by mouth daily.     cholecalciferol (VITAMIN D) 1000 units tablet Take 1,000 Units by mouth daily.     colestipol (COLESTID) 1 g tablet colestipol 1 gram tablet (Patient not taking: Reported on 05/22/2022)     dapagliflozin propanediol (FARXIGA) 10 MG TABS tablet Farxiga 10 mg tablet     diphenoxylate-atropine (LOMOTIL) 2.5-0.025 MG tablet Take 1 tablet by mouth 4 (four) times daily as needed. (Patient not taking: Reported on 05/22/2022)     gabapentin (NEURONTIN) 300 MG capsule gabapentin 300 mg capsule  TAKE 1 CAPSULE BY  MOUTH IN THE MORNING, AND TAKE 2 CAPSULES AT NIGHT     glipiZIDE (GLUCOTROL) 5 MG tablet glipizide 5 mg tablet     hydrochlorothiazide (HYDRODIURIL) 25 MG tablet Take 12.5 mg by mouth daily.  0   ipratropium (ATROVENT ) 0.03 % nasal spray Place 2 sprays into both nostrils every 12 (twelve) hours. 30 mL 12   metoprolol succinate (TOPROL-XL) 50 MG 24 hr tablet Take 50 mg by mouth daily.  1   rosuvastatin (CRESTOR) 10 MG tablet Take 10 mg by mouth at bedtime.     vitamin B-12 (CYANOCOBALAMIN ) 1000 MCG tablet Take 1,000 mcg by mouth daily.     vitamin C (ASCORBIC ACID) 500 MG tablet Take 500 mg by mouth daily.     No current facility-administered medications for this visit.     Past Medical History:  Diagnosis Date   CAD (coronary artery disease)    Hyperlipidemia    Hypertension    Myocardial infarction [facility]) 2002    Past Surgical History:  Procedure Laterality Date   ABDOMINAL HYSTERECTOMY  1 ovary removed   APPENDECTOMY  8th grade   CHOLECYSTECTOMY  april 2013   EYE SURGERY Left 05-25-2013   cataract lens replacement   other surgery  2007   12 other surgeries due to ruptued aneurysm   right knee meniscus repair  sept 30, 2016   stent to heart      right rca 7024-2002   surgery for ruptured abdominal aortic aneurysm  2007  TONSILLECTOMY  7th grade   TOTAL KNEE ARTHROPLASTY Right 04/11/2015   Procedure: TOTAL RIGHT KNEE ARTHROPLASTY;  Surgeon: Hazle Lites, MD;  Location: [facility];  Service: Orthopedics;  Laterality: Right;   TUBAL LIGATION  1971    Social History   Socioeconomic History   Marital status: Widowed    Spouse name: Not on file   Number of children: 2   Years of education: Not on file   Highest education level: Not on file  Occupational History   Not on file  Tobacco Use   Smoking status: Former    Current packs/day: 0.00    Average packs/day: 0.5 packs/day for 20.0 years (10.0 ttl pk-yrs)    Types: Cigarettes    Start date: 41    Quit date: 2000     Years since quitting: 25.4   Smokeless tobacco: Never  Vaping Use   Vaping status: Never Used  Substance and Sexual Activity   Alcohol use: No   Drug use: No   Sexual activity: Not on file  Other Topics Concern   Not on file  Social History Narrative   Not on file   Social Drivers of Health   Financial Resource Strain: Low Risk  (01/17/2023)   Received from [facility]   Overall Financial Resource Strain (CARDIA)    Difficulty of Paying Living Expenses: Not hard at all  Food Insecurity: No Food Insecurity (01/17/2023)   Received from [facility]   Hunger Vital Sign    Worried About Running Out of Food in the Last Year: Never true    Ran Out of Food in the Last Year: Never true  Transportation Needs: Unknown (01/17/2023)   Received from [facility] - Transportation    In the past 12 months, has lack of transportation kept you from medical appointments or from getting medications?: No    Lack of Transportation (Non-Medical): Not on file  Physical Activity: Not on file  Stress: Not on file  Social Connections: Not on file  Intimate Partner Violence: Not on file    Family History  Problem Relation Age of Onset   CAD Mother    CAD Brother     ROS: no fevers or chills, productive cough, hemoptysis, dysphasia, odynophagia, melena, hematochezia, dysuria, hematuria, rash, seizure activity, orthopnea, PND, pedal edema, claudication. Remaining systems are negative.  Physical Exam: Well-developed well-nourished in no acute distress.  Skin is warm and dry.  HEENT is normal.  Neck is supple.  Chest is clear to auscultation with normal expansion.  Cardiovascular exam is regular rate and rhythm.  Abdominal exam nontender or distended. No masses palpated. Extremities show no edema. neuro grossly intact  EKG Interpretation Date/Time:  Thursday Jul 31 2023 10:56:04 EDT Ventricular Rate:  70 PR Interval:  192 QRS  Duration:  96 QT Interval:  414 QTC Calculation: 447 R Axis:   5  Text Interpretation: Sinus rhythm with occasional Premature ventricular complexes Low voltage QRS Confirmed by Alexandria Angel ([id number]) on 07/31/2023 11:02:33 AM    A/P  1 coronary artery disease-she is doing well with no chest pain.  Continue aspirin  and statin.  2 status post abdominal aortic aneurysm repair-plan follow-up ultrasound March 2026.  3 hypertension-patient's blood pressure is controlled.  Continue present medical regimen.  4 hyperlipidemia-continue statin.  Note she did not tolerate higher doses previously.  Alexandria Angel, MD

## 2023-07-31 ENCOUNTER — Ambulatory Visit: Payer: Medicare Other | Attending: Cardiology | Admitting: Cardiology

## 2023-07-31 ENCOUNTER — Encounter: Payer: Self-pay | Admitting: Cardiology

## 2023-07-31 VITALS — BP 118/62 | HR 69 | Ht 65.0 in | Wt 206.0 lb

## 2023-07-31 DIAGNOSIS — I10 Essential (primary) hypertension: Secondary | ICD-10-CM

## 2023-07-31 DIAGNOSIS — I2510 Atherosclerotic heart disease of native coronary artery without angina pectoris: Secondary | ICD-10-CM | POA: Diagnosis not present

## 2023-07-31 DIAGNOSIS — I714 Abdominal aortic aneurysm, without rupture, unspecified: Secondary | ICD-10-CM

## 2023-07-31 DIAGNOSIS — E7800 Pure hypercholesterolemia, unspecified: Secondary | ICD-10-CM | POA: Diagnosis not present

## 2023-07-31 NOTE — Patient Instructions (Signed)
  Follow-Up: At [facility], you and your health needs are our priority.  As part of our continuing mission to provide you with exceptional heart care, our providers are all part of one team.  This team includes your primary Cardiologist (physician) and Advanced Practice Providers or APPs (Physician Assistants and Nurse Practitioners) who all work together to provide you with the care you need, when you need it.  Your next appointment:   12 month(s)  Provider:   Alexandria Angel, MD   We recommend signing up for the patient portal called "MyChart".  Sign up information is provided on this After Visit Summary.  MyChart is used to connect with patients for Virtual Visits (Telemedicine).  Patients are able to view lab/test results, encounter notes, upcoming appointments, etc.  Non-urgent messages can be sent to your provider as well.   To learn more about what you can do with MyChart, go to [email or web address].
